# Patient Record
Sex: Female | Born: 1955 | Race: Black or African American | Hispanic: No | Marital: Single | Smoking: Never smoker
Health system: Southern US, Community
[De-identification: ages and names within clinical notes are randomized; demographics above are authoritative.]

## PROBLEM LIST (undated history)

## (undated) DIAGNOSIS — D219 Benign neoplasm of connective and other soft tissue, unspecified: Secondary | ICD-10-CM

## (undated) HISTORY — DX: Benign neoplasm of connective and other soft tissue, unspecified: D21.9

## (undated) HISTORY — PX: OTHER SURGICAL HISTORY: SHX169

## (undated) HISTORY — PX: EYE SURGERY: SHX253

---

## 1999-05-19 ENCOUNTER — Other Ambulatory Visit: Admission: RE | Admit: 1999-05-19 | Discharge: 1999-05-19 | Payer: Self-pay | Admitting: *Deleted

## 1999-05-19 ENCOUNTER — Encounter (INDEPENDENT_AMBULATORY_CARE_PROVIDER_SITE_OTHER): Payer: Self-pay | Admitting: Specialist

## 2000-06-18 ENCOUNTER — Encounter: Payer: Self-pay | Admitting: *Deleted

## 2000-06-18 ENCOUNTER — Ambulatory Visit (HOSPITAL_COMMUNITY): Admission: RE | Admit: 2000-06-18 | Discharge: 2000-06-18 | Payer: Self-pay | Admitting: *Deleted

## 2001-07-21 ENCOUNTER — Other Ambulatory Visit: Admission: RE | Admit: 2001-07-21 | Discharge: 2001-07-21 | Payer: Self-pay | Admitting: *Deleted

## 2002-04-21 ENCOUNTER — Ambulatory Visit: Admission: RE | Admit: 2002-04-21 | Discharge: 2002-04-21 | Payer: Self-pay | Admitting: Gynecology

## 2002-04-22 ENCOUNTER — Ambulatory Visit (HOSPITAL_COMMUNITY): Admission: RE | Admit: 2002-04-22 | Discharge: 2002-04-22 | Payer: Self-pay | Admitting: Gynecology

## 2002-04-22 ENCOUNTER — Encounter: Payer: Self-pay | Admitting: Gynecology

## 2002-04-23 ENCOUNTER — Encounter: Payer: Self-pay | Admitting: Gynecology

## 2002-04-23 ENCOUNTER — Ambulatory Visit (HOSPITAL_COMMUNITY): Admission: RE | Admit: 2002-04-23 | Discharge: 2002-04-23 | Payer: Self-pay | Admitting: Gynecology

## 2002-05-04 ENCOUNTER — Ambulatory Visit: Admission: RE | Admit: 2002-05-04 | Discharge: 2002-05-04 | Payer: Self-pay | Admitting: *Deleted

## 2003-07-01 ENCOUNTER — Other Ambulatory Visit: Admission: RE | Admit: 2003-07-01 | Discharge: 2003-07-01 | Payer: Self-pay | Admitting: *Deleted

## 2007-02-05 ENCOUNTER — Other Ambulatory Visit: Admission: RE | Admit: 2007-02-05 | Discharge: 2007-02-05 | Payer: Self-pay | Admitting: *Deleted

## 2008-12-31 ENCOUNTER — Ambulatory Visit (HOSPITAL_BASED_OUTPATIENT_CLINIC_OR_DEPARTMENT_OTHER): Admission: RE | Admit: 2008-12-31 | Discharge: 2008-12-31 | Payer: Self-pay | Admitting: Family Medicine

## 2008-12-31 ENCOUNTER — Ambulatory Visit: Payer: Self-pay | Admitting: Interventional Radiology

## 2009-06-24 ENCOUNTER — Ambulatory Visit (HOSPITAL_COMMUNITY): Admission: RE | Admit: 2009-06-24 | Discharge: 2009-06-24 | Payer: Self-pay | Admitting: Obstetrics & Gynecology

## 2010-07-12 ENCOUNTER — Encounter: Payer: Self-pay | Admitting: Internal Medicine

## 2010-07-24 ENCOUNTER — Encounter (INDEPENDENT_AMBULATORY_CARE_PROVIDER_SITE_OTHER): Payer: Self-pay | Admitting: *Deleted

## 2010-08-25 ENCOUNTER — Encounter (INDEPENDENT_AMBULATORY_CARE_PROVIDER_SITE_OTHER): Payer: Self-pay | Admitting: *Deleted

## 2010-08-30 ENCOUNTER — Ambulatory Visit
Admission: RE | Admit: 2010-08-30 | Discharge: 2010-08-30 | Payer: Self-pay | Source: Home / Self Care | Attending: Internal Medicine | Admitting: Internal Medicine

## 2010-09-08 ENCOUNTER — Encounter: Payer: Self-pay | Admitting: Internal Medicine

## 2010-09-08 ENCOUNTER — Ambulatory Visit
Admission: RE | Admit: 2010-09-08 | Discharge: 2010-09-08 | Payer: Self-pay | Source: Home / Self Care | Attending: Internal Medicine | Admitting: Internal Medicine

## 2010-09-26 NOTE — Letter (Signed)
Summary: Pre Visit Letter Revised  [facility]  [address] [place], [place] [address]   Phone: [phone]  Fax: [phone]        07/24/2010 MRN: [id number] Jordan Miles [address] [place], [place]  [address]             Procedure Date:  09/08/2010   Welcome to the Gastroenterology Division at [facility].    You are scheduled to see a nurse for your pre-procedure visit on 08/30/2010 at 1100 AM on the 3rd floor at [facility], [address]  We ask that you try to arrive at our office 15 minutes prior to your appointment time to allow for check-in.  Please take a minute to review the attached form.  If you answer "Yes" to one or more of the questions on the first page, we ask that you call the person listed at your earliest opportunity.  If you answer "No" to all of the questions, please complete the rest of the form and bring it to your appointment.    Your nurse visit will consist of discussing your medical and surgical history, your immediate family medical history, and your medications.   If you are unable to list all of your medications on the form, please bring the medication bottles to your appointment and we will list them.  We will need to be aware of both prescribed and over the counter drugs.  We will need to know exact dosage information as well.    Please be prepared to read and sign documents such as consent forms, a financial agreement, and acknowledgement forms.  If necessary, and with your consent, a friend or relative is welcome to sit-in on the nurse visit with you.  Please bring your insurance card so that we may make a copy of it.  If your insurance requires a referral to see a specialist, please bring your referral form from your primary care physician.  No co-pay is required for this nurse visit.     If you cannot keep your appointment, please call [phone] to cancel or reschedule prior to your appointment date.  This  allows [place] the opportunity to schedule an appointment for another patient in need of care.    Thank you for choosing [facility] for your medical needs.  We appreciate the opportunity to care for you.  Please visit [place] at our website  to learn more about our practice.  Sincerely, The Gastroenterology Division

## 2010-09-26 NOTE — Letter (Signed)
Summary: Pre Visit Letter Revised  [facility]  [address] [place], [place] [address]   Phone: [phone]  Fax: [phone]        07/12/2010 MRN: [id number]  Jordan Miles [address] [place], [place]  [address]             Procedure Date:  12-13 at 2:30pm   Welcome to the Gastroenterology Division at [facility].    You are scheduled to see a nurse for your pre-procedure visit on 07-25-10 at 4:30pm on the 3rd floor at [facility], [address].  We ask that you try to arrive at our office 15 minutes prior to your appointment time to allow for check-in.  Please take a minute to review the attached form.  If you answer "Yes" to one or more of the questions on the first page, we ask that you call the person listed at your earliest opportunity.  If you answer "No" to all of the questions, please complete the rest of the form and bring it to your appointment.    Your nurse visit will consist of discussing your medical and surgical history, your immediate family medical history, and your medications.   If you are unable to list all of your medications on the form, please bring the medication bottles to your appointment and we will list them.  We will need to be aware of both prescribed and over the counter drugs.  We will need to know exact dosage information as well.    Please be prepared to read and sign documents such as consent forms, a financial agreement, and acknowledgement forms.  If necessary, and with your consent, a friend or relative is welcome to sit-in on the nurse visit with you.  Please bring your insurance card so that we may make a copy of it.  If your insurance requires a referral to see a specialist, please bring your referral form from your primary care physician.  No co-pay is required for this nurse visit.     If you cannot keep your appointment, please call [phone] to cancel or reschedule prior to your appointment date.   This allows [place] the opportunity to schedule an appointment for another patient in need of care.    Thank you for choosing [facility] for your medical needs.  We appreciate the opportunity to care for you.  Please visit [place] at our website  to learn more about our practice.  Sincerely, The Gastroenterology Division

## 2010-09-28 NOTE — Procedures (Signed)
Summary: Colonoscopy  Patient: Jordan Miles Note: All result statuses are Final unless otherwise noted.  Tests: (1) Colonoscopy (COL)   COL Colonoscopy           DONE (C)     [facility]     [address]     [place], [place]  [address]           COLONOSCOPY PROCEDURE REPORT           PATIENT:  Jordan, Miles  MR#:  [id number]     BIRTHDATE:  06-19-1956, 55 yrs. old  GENDER:  female     ENDOSCOPIST:  Hedwig Morton. Juanda Chance, MD     REF. BY:  Bernadette Hoit, M.D.     PROCEDURE DATE:  09/08/2010     PROCEDURE:  Colonoscopy 19[address]     ASA CLASS:  Class I     INDICATIONS:  family history of colon cancer, mother with colon     cancer age 55, MGM  suspected colon cancer ( had colostomy at age     2, lived  1 year after colostomy), all relatives  are from     [place]     MEDICATIONS:   Versed 5 mg, Fentanyl 50 mcg           DESCRIPTION OF PROCEDURE:   After the risks benefits and     alternatives of the procedure were thoroughly explained, informed     consent was obtained.  Digital rectal exam was performed and     revealed no rectal masses.   The LB 180AL [id number] endoscope was     introduced through the anus and advanced to the cecum, which was     identified by both the appendix and ileocecal valve, without     limitations.  The quality of the prep was good, using MiraLax.     The instrument was then slowly withdrawn as the colon was fully     examined.     <<PROCEDUREIMAGES>>           FINDINGS:  Mild diverticulosis was found in the sigmoid colon (see     image1 and image2).  This was otherwise a normal examination of     the colon (see image5, image4, and image3).   Retroflexed views in     the rectum revealed no abnormalities.    The scope was then     withdrawn from the patient and the procedure completed.           COMPLICATIONS:  None     ENDOSCOPIC IMPRESSION:     1) Mild diverticulosis in the sigmoid colon     2) Otherwise normal examination     3) Normal  colonoscopy     RECOMMENDATIONS:     1) high fiber diet     REPEAT EXAM:  In 5 year(s) for.           ______________________________     Hedwig Morton. Juanda Chance, MD           CC:  Bernadette Hoit, M.D.           n.     REVISED:  09/08/2010 10:59 AM     eSIGNED:   Hedwig Morton. Jayleena Stille at 09/08/2010 10:59 AM           Przybylski, Khushboo, [id number]  Note: An exclamation mark (!) indicates a result that was not dispersed into the flowsheet. Document Creation Date: 09/08/2010 11:00 AM _______________________________________________________________________  (1)  Order result status: Final Collection or observation date-time: 09/08/2010 09:51 Requested date-time:  Receipt date-time:  Reported date-time:  Referring Physician:   Ordering Physician: Lina Sar [phone]) Specimen Source:  Source: Launa Grill Order Number: [id number] Lab site:   Appended Document: Colonoscopy    Clinical Lists Changes  Observations: Added new observation of COLONNXTDUE: 08/2015 (09/08/2010 14:24)

## 2010-09-28 NOTE — Miscellaneous (Signed)
Summary: [facility] Previsit/prep  Clinical Lists Changes  Medications: Added new medication of MIRALAX   POWD (POLYETHYLENE GLYCOL 3350) As per prep  instructions. - Signed Added new medication of METOCLOPRAMIDE HCL 10 MG  TABS (METOCLOPRAMIDE HCL) As per prep instructions. - Signed Added new medication of DULCOLAX 5 MG  TBEC (BISACODYL) Day before procedure take 2 at 3pm and 2 at 8pm. - Signed Rx of MIRALAX   POWD (POLYETHYLENE GLYCOL 3350) As per prep  instructions.;  #255gm x 0;  Signed;  Entered by: Wyona Almas RN;  Authorized by: Hart Carwin MD;  Method used: Electronically to [facility] [phone] [place], [place], [place]  [address], Ph: [phone], Fax: [phone] Rx of METOCLOPRAMIDE HCL 10 MG  TABS (METOCLOPRAMIDE HCL) As per prep instructions.;  #2 x 0;  Signed;  Entered by: Wyona Almas RN;  Authorized by: Hart Carwin MD;  Method used: Electronically to [facility] [phone] [place], [place], [place]  [address], Ph: [phone], Fax: [phone] Rx of DULCOLAX 5 MG  TBEC (BISACODYL) Day before procedure take 2 at 3pm and 2 at 8pm.;  #4 x 0;  Signed;  Entered by: Wyona Almas RN;  Authorized by: Hart Carwin MD;  Method used: Electronically to [facility] [phone] [place], [place], [place]  [address], Ph: [phone], Fax: [phone] Observations: Added new observation of NKA: T (08/30/2010 10:54)    Prescriptions: DULCOLAX 5 MG  TBEC (BISACODYL) Day before procedure take 2 at 3pm and 2 at 8pm.  #4 x 0   Entered by:   Wyona Almas RN   Authorized by:   Hart Carwin MD   Signed by:   Wyona Almas RN on 08/30/2010   Method used:   Electronically to        [facility] [address] (retail)       [phone]       [place], [place]  [address]       Ph: [phone]       Fax: [phone]   RxID:   [id number] METOCLOPRAMIDE  HCL 10 MG  TABS (METOCLOPRAMIDE HCL) As per prep instructions.  #2 x 0   Entered by:   Wyona Almas RN   Authorized by:   Hart Carwin MD   Signed by:   Wyona Almas RN on 08/30/2010   Method used:   Electronically to        [facility] [address] (retail)       [phone]       [place], [place]  [address]       Ph: [phone]       Fax: [phone]   RxID:   [id number] MIRALAX   POWD (POLYETHYLENE GLYCOL 3350) As per prep  instructions.  #255gm x 0   Entered by:   Wyona Almas RN   Authorized by:   Hart Carwin MD   Signed by:   Wyona Almas RN on 08/30/2010   Method used:   Electronically to        [facility] [address] (retail)       [phone]       [place], [place]  [address]       Ph: [phone]  Fax: [phone]   RxID:   [id number]

## 2010-09-28 NOTE — Letter (Signed)
Summary: [facility] Instructions  [facility]  [address] [place], [place] [address]   Phone: [phone]  Fax: [phone]       Jordan Miles    Jul 21, 1956    MRN: [id number]       Procedure Day /Date: Friday 09-08-10     Arrival Time:  8:30 a.m.     Procedure Time: 9:30 a.m.     Location of Procedure:                    _x _  [facility] (4th Floor)   PREPARATION FOR COLONOSCOPY WITH MIRALAX  Starting 5 days prior to your procedure  09-03-10 do not eat nuts, seeds, popcorn, corn, beans, peas,  salads, or any raw vegetables.  Do not take any fiber supplements (e.g. Metamucil, Citrucel, and Benefiber). ____________________________________________________________________________________________________   THE DAY BEFORE YOUR PROCEDURE         DATE:  09-07-10    DAY: Thursday  1   Drink clear liquids the entire day-NO SOLID FOOD  2   Do not drink anything colored red or purple.  Avoid juices with pulp.  No orange juice.  3   Drink at least 64 oz. (8 glasses) of fluid/clear liquids during the day to prevent dehydration and help the prep work efficiently.  CLEAR LIQUIDS INCLUDE: Water Jello Ice Popsicles Tea (sugar ok, no milk/cream) Powdered fruit flavored drinks Coffee (sugar ok, no milk/cream) Gatorade Juice: apple, white grape, white cranberry  Lemonade Clear bullion, consomm, broth Carbonated beverages (any kind) Strained chicken noodle soup Hard Candy  4   Mix the entire bottle of Miralax with 64 oz. of Gatorade/Powerade in the morning and put in the refrigerator to chill.  5   At 3:00 pm take 2 Dulcolax/Bisacodyl tablets.  6   At 4:30 pm take one Reglan/Metoclopramide tablet.  7  Starting at 5:00 pm drink one 8 oz glass of the Miralax mixture every 15-20 minutes until you have finished drinking the entire 64 oz.  You should finish drinking prep around 7:30 or 8:00 pm.  8   If you are nauseated, you may take the 2nd  Reglan/Metoclopramide tablet at 6:30 pm.        9    At 8:00 pm take 2 more DULCOLAX/Bisacodyl tablets.     THE DAY OF YOUR PROCEDURE      DATE:   09-08-10  DAY:  Friday   You may drink clear liquids until  7:30 a.m.  (2 HOURS BEFORE PROCEDURE).   MEDICATION INSTRUCTIONS  Unless otherwise instructed, you should take regular prescription medications with a small sip of water as early as possible the morning of your procedure.        OTHER INSTRUCTIONS  You will need a responsible adult at least 57 years of age to accompany you and drive you home.   This person must remain in the waiting room during your procedure.  Wear loose fitting clothing that is easily removed.  Leave jewelry and other valuables at home.  However, you may wish to bring a book to read or an iPod/MP3 player to listen to music as you wait for your procedure to start.  Remove all body piercing jewelry and leave at home.  Total time from sign-in until discharge is approximately 2-3 hours.  You should go home directly after your procedure and rest.  You can resume normal activities the day after your procedure.  The day of your procedure  you should not:   Drive   Make legal decisions   Operate machinery   Drink alcohol   Return to work  You will receive specific instructions about eating, activities and medications before you leave.   The above instructions have been reviewed and explained to me by   Wyona Almas RN  August 30, 2010 11:37 AM     I fully understand and can verbalize these instructions _____________________________ Date _______

## 2011-01-12 NOTE — Consult Note (Signed)
Jordan Miles, Jordan Miles                         ACCOUNT NO.:  [id number]   MEDICAL RECORD NO.:  [id number]                   PATIENT TYPE:  OUT   LOCATION:  GYN                                  FACILITY:  [facility]   PHYSICIAN:  Rande Brunt. Clarke-Pearson, M.D.      DATE OF BIRTH:  [birth year]   DATE OF CONSULTATION:  04/21/2002  DATE OF DISCHARGE:                                 GYN CONSULTATION   The patient is a 55 year old [name] seen in consultation at  the request of Dr. Jeanine Luz regarding management of uterine fibroids.   Currently, the patient is having considerable amount of pain, especially in  the right lower quadrant, difficulty with constipation, and worsening  hemorrhoids.  It is felt that her fibroids have become somewhat larger and  tender on palpation.   The patient has a past history of undergoing a myomectomy in 1996.  She has  never been pregnant and continues to desire to maintain fertility despite  her age of age 59.  She is requesting a myomectomy.   The patient has some irregular menstrual periods.  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  She has hot flushes most nights and appears to be approaching  menopause.   PAST MEDICAL HISTORY:  Medical illnesses:  None.   PAST SURGICAL HISTORY:  Umbilical herniorrhaphy as a child, myomectomy 1996.   ALLERGIES:  None.   OBSTETRIC HISTORY:  Gravida 0.   FAMILY HISTORY:  Negative for breast, ovarian, or colon cancer.   SOCIAL HISTORY:  The patient is single.  She does not smoke.  She drinks  alcohol on occasion.  She works in the Engineer, production.   CURRENT MEDICATIONS:  Multivitamins.   REVIEW OF SYMPTOMS:  Negative except as noted in history of present illness.   PHYSICAL EXAMINATION:  VITAL SIGNS:  Height 5 feet 2 inches, weight 168  pounds, blood pressure 124/84, pulse 72, respiratory rate 18.  GENERAL:  The  patient is a healthy female in no acute distress.  HEENT:  Negative.  NECK:  Supple without thyromegaly.  LYMPH:  There is no supraclavicular or inguinal adenopathy.  ABDOMEN:  Soft and nontender.  She does have a mass extending just below the  umbilicus and this is slightly tender to deep palpation.  PELVIC:  EGBUS, vagina, bladder, urethra normal.  Cervix is deviated  anteriorly.  Uterus is irregular and approximately 18-20 weeks size reaching  just below the umbilicus.  It is slightly mobile.  Rectovaginal examination  confirms the patient does have mild to moderate hemorrhoids.   IMPRESSION:  Uterine fibroids.  Review of records indicates the patient had  18 week sized fibroids in the year 2000.  These may be slightly bigger on  clinical examination today.  I do note she has had a past ultrasound which  showed no evidence of ureteral obstruction, however, that  was nearly two  years ago.   Relatively certain that her discomfort is coming from her fibroids.  However, I think it is worth considering all of her options.  While the  patient is considering myomectomy (in the hopes that she may preserve  fertility as well as because she is afraid she will have emotional  difficulties following a hysterectomy), I think this is not really the best  medical or surgical option for the patient.  I did indicate to her that if  surgical intervention were considered, an abdominal hysterectomy would  certainly, on average, be less morbid.  However, she is quite concerned and  anxious about hysterectomy indicating that other women in her family that  have had hysterectomies have had great difficulties and essentially from an  emotional point of view she would refuse a hysterectomy.   Alternative methods of management, given the fact that this patient is close  to menopause, would be consideration of the use of Depo Lupron for six  months.  I think it is possible that there may be enough shrinkage  of the  fibroids that she may be relieved of her discomfort which is her primary  symptom.  The patient would consider this, it seems to me, after discussing  it with her at length.   While she is considering her options, I would suggest we obtain a pelvic and  renal ultrasound to evaluate the size of the uterine fibroids, the  endometrial stripe, and whether there is any evidence of hydronephrosis.  The patient will contact us or Dr. Delorse Lek office when she has decided how  she would like to proceed as she is considering medical versus surgical  options.                                               Daniel L. Stanford Breed, M.D.    DLC/MEDQ  D:  04/21/2002  T:  04/22/2002  Job:  [id number]   cc:   Almedia Balls. Randell Patient, M.D.   Telford Nab, R.N.

## 2013-03-24 ENCOUNTER — Other Ambulatory Visit (HOSPITAL_COMMUNITY): Payer: Self-pay | Admitting: Obstetrics & Gynecology

## 2013-03-24 ENCOUNTER — Other Ambulatory Visit (HOSPITAL_COMMUNITY): Payer: Self-pay | Admitting: Family Medicine

## 2013-03-24 DIAGNOSIS — Z1231 Encounter for screening mammogram for malignant neoplasm of breast: Secondary | ICD-10-CM

## 2013-04-03 ENCOUNTER — Ambulatory Visit (HOSPITAL_COMMUNITY)
Admission: RE | Admit: 2013-04-03 | Discharge: 2013-04-03 | Disposition: A | Discharge status: Home / Self Care | Payer: BC Managed Care – PPO | Source: Ambulatory Visit | Attending: Obstetrics & Gynecology | Admitting: Obstetrics & Gynecology

## 2013-04-03 DIAGNOSIS — Z1231 Encounter for screening mammogram for malignant neoplasm of breast: Secondary | ICD-10-CM

## 2014-02-12 DIAGNOSIS — M659 Synovitis and tenosynovitis, unspecified: Secondary | ICD-10-CM | POA: Insufficient documentation

## 2014-02-12 DIAGNOSIS — M79603 Pain in arm, unspecified: Secondary | ICD-10-CM | POA: Insufficient documentation

## 2014-02-12 DIAGNOSIS — D259 Leiomyoma of uterus, unspecified: Secondary | ICD-10-CM | POA: Insufficient documentation

## 2014-02-12 DIAGNOSIS — E7800 Pure hypercholesterolemia, unspecified: Secondary | ICD-10-CM | POA: Insufficient documentation

## 2014-02-12 DIAGNOSIS — F4322 Adjustment disorder with anxiety: Secondary | ICD-10-CM | POA: Insufficient documentation

## 2014-02-12 DIAGNOSIS — M25511 Pain in right shoulder: Secondary | ICD-10-CM | POA: Insufficient documentation

## 2014-02-12 DIAGNOSIS — K644 Residual hemorrhoidal skin tags: Secondary | ICD-10-CM | POA: Insufficient documentation

## 2014-02-12 DIAGNOSIS — J209 Acute bronchitis, unspecified: Secondary | ICD-10-CM | POA: Insufficient documentation

## 2014-02-12 DIAGNOSIS — H16229 Keratoconjunctivitis sicca, not specified as Sjogren's, unspecified eye: Secondary | ICD-10-CM | POA: Insufficient documentation

## 2014-02-12 DIAGNOSIS — E663 Overweight: Secondary | ICD-10-CM | POA: Insufficient documentation

## 2014-02-12 DIAGNOSIS — J309 Allergic rhinitis, unspecified: Secondary | ICD-10-CM | POA: Insufficient documentation

## 2014-02-12 DIAGNOSIS — N951 Menopausal and female climacteric states: Secondary | ICD-10-CM | POA: Insufficient documentation

## 2014-05-06 DIAGNOSIS — M25551 Pain in right hip: Secondary | ICD-10-CM | POA: Insufficient documentation

## 2014-05-06 DIAGNOSIS — R030 Elevated blood-pressure reading, without diagnosis of hypertension: Secondary | ICD-10-CM

## 2014-05-06 DIAGNOSIS — IMO0001 Reserved for inherently not codable concepts without codable children: Secondary | ICD-10-CM | POA: Insufficient documentation

## 2014-06-09 ENCOUNTER — Other Ambulatory Visit (HOSPITAL_COMMUNITY): Payer: Self-pay | Admitting: Obstetrics & Gynecology

## 2014-06-09 DIAGNOSIS — Z1231 Encounter for screening mammogram for malignant neoplasm of breast: Secondary | ICD-10-CM

## 2014-07-15 ENCOUNTER — Ambulatory Visit (HOSPITAL_COMMUNITY)
Admission: RE | Admit: 2014-07-15 | Discharge: 2014-07-15 | Disposition: A | Discharge status: Home / Self Care | Payer: No Typology Code available for payment source | Source: Ambulatory Visit | Attending: Obstetrics & Gynecology | Admitting: Obstetrics & Gynecology

## 2014-07-15 DIAGNOSIS — Z1231 Encounter for screening mammogram for malignant neoplasm of breast: Secondary | ICD-10-CM | POA: Insufficient documentation

## 2014-08-04 DIAGNOSIS — K219 Gastro-esophageal reflux disease without esophagitis: Secondary | ICD-10-CM | POA: Insufficient documentation

## 2014-09-21 ENCOUNTER — Ambulatory Visit (INDEPENDENT_AMBULATORY_CARE_PROVIDER_SITE_OTHER): Payer: 59

## 2014-09-21 VITALS — BP 139/82 | HR 86 | Resp 18

## 2014-09-21 DIAGNOSIS — Q828 Other specified congenital malformations of skin: Secondary | ICD-10-CM

## 2014-09-21 DIAGNOSIS — M2041 Other hammer toe(s) (acquired), right foot: Secondary | ICD-10-CM

## 2014-09-21 DIAGNOSIS — R52 Pain, unspecified: Secondary | ICD-10-CM

## 2014-09-21 NOTE — Patient Instructions (Signed)
Corns and Calluses Corns are small areas of thickened skin that usually occur on the top, sides, or tip of a toe. They contain a cone-shaped core with a point that can press on a nerve below. This causes pain. Calluses are areas of thickened skin that usually develop on hands, fingers, palms, soles of the feet, and heels. These are areas that experience frequent friction or pressure. CAUSES  Corns are usually the result of rubbing (friction) or pressure from shoes that are too tight or do not fit properly. Calluses are caused by repeated friction and pressure on the affected areas. SYMPTOMS  A hard growth on the skin.  Pain or tenderness under the skin.  Sometimes, redness and swelling.  Increased discomfort while wearing tight-fitting shoes. DIAGNOSIS  Your caregiver can usually tell what the problem is by doing a physical exam. TREATMENT  Removing the cause of the friction or pressure is usually the only treatment needed. However, sometimes medicines can be used to help soften the hardened, thickened areas. These medicines include salicylic acid plasters and 12% ammonium lactate lotion. These medicines should only be used under the direction of your caregiver. HOME CARE INSTRUCTIONS   Try to remove pressure from the affected area.  You may wear donut-shaped corn pads to protect your skin.  You may use a pumice stone or nonmetallic nail file to gently reduce the thickness of a corn.  Wear properly fitted footwear.  If you have calluses on the hands, wear gloves during activities that cause friction.  If you have diabetes, you should regularly examine your feet. Tell your caregiver if you notice any problems with your feet. SEEK IMMEDIATE MEDICAL CARE IF:   You have increased pain, swelling, redness, or warmth in the affected area.  Your corn or callus starts to drain fluid or bleeds.  You are not getting better, even with treatment. Document Released: 05/19/2004 Document  Revised: 11/05/2011 Document Reviewed: 04/10/2011 [facility] Patient Information 2015 [place], [place]. This information is not intended to replace advice given to you by your health care provider. Make sure you discuss any questions you have with your health care provider.   Corns are being caused by the Cricket or hammertoe deformity of the her third toe. The only way to provide permanent correction would be due to surgery to remove a portion of bone and straighten the toe. 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

## 2014-09-21 NOTE — Progress Notes (Signed)
   Subjective:    Patient ID: Jordan Miles, female    DOB: [birth year], 59 y.o.   MRN: [id number]  HPI my 3rd toe on my right foot and has been going on for about 6 months and is getting bigger and is sore and tender and hurts with shoes and I would like to see if I can get a shot and was not an issue when I was not working and now that I have gone back to work and is getting worse    Review of Systems  All other systems reviewed and are negative.      Objective:   Physical Exam 59 year old [place] American female well-developed well-nourished oriented 3 presents this time with a painful keratoses third toe right foot is been there for more than 6 months in duration patient is a history of previous surgery for hammertoe repair fifth toe right foot by Dr. Janus Molder.  Patient at this time is having pain with enclosed shoes walking and activities. X-rays confirm semirigid digital contracture of toes 234 with the third toe being most dorsally elevated at the IP joint. No open wounds no ulcers no secondary infections there is hemorrhage a keratoses with a punctate likely keratotic lesion over the proximal IP joint second third digit right foot. Ace and fourth digits also have some superficial keratoses although these are not painful or symptomatic at current time. Pedal pulses are palpable DP and PT +2 over 4 bilateral Refill time 3 seconds all digits. Epicritic and proprioceptive sensations intact and symmetric bilateral there is normal plantar response and DTRs. Dermatologic the skin color pigment normal hair growth absent nails normal there is keratoses HD 3 right with a nucleation or care punctate core.       Assessment & Plan:  Assessment hammertoe deformity associated poor keratoses keratotic lesion is debrided to foam padding dispensed. 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 Patient will  consider her options and follow-up in the future as needed patient did have a question about a steroid injection which I provided indicated would provide her temporary relief anklet evenly to break down or atrophy of the tissue and recommendations time is to avoid the steroid which is not a cure and may even create complication instead dispersion padding is applied after debridement follow-up when ready for surgery and hammertoe repair in the future.\  Harriet Masson DPM

## 2015-02-11 ENCOUNTER — Telehealth: Payer: Self-pay | Admitting: Internal Medicine

## 2015-02-14 NOTE — Telephone Encounter (Signed)
Left a message for patient to call back. 

## 2015-02-14 NOTE — Telephone Encounter (Signed)
Spoke with patient and scheduled OV on 02/25/15 at 2:00 PM.

## 2015-02-25 ENCOUNTER — Ambulatory Visit (INDEPENDENT_AMBULATORY_CARE_PROVIDER_SITE_OTHER): Payer: 59 | Admitting: Internal Medicine

## 2015-02-25 ENCOUNTER — Encounter: Payer: Self-pay | Admitting: Internal Medicine

## 2015-02-25 ENCOUNTER — Other Ambulatory Visit: Payer: 59

## 2015-02-25 VITALS — BP 142/82 | HR 88 | Ht 61.75 in | Wt 172.8 lb

## 2015-02-25 DIAGNOSIS — K219 Gastro-esophageal reflux disease without esophagitis: Secondary | ICD-10-CM

## 2015-02-25 DIAGNOSIS — R109 Unspecified abdominal pain: Secondary | ICD-10-CM

## 2015-02-25 LAB — HEPATIC FUNCTION PANEL
ALT: 16 U/L (ref 0–35)
AST: 17 U/L (ref 0–37)
Albumin: 4.2 g/dL (ref 3.5–5.2)
Alkaline Phosphatase: 52 U/L (ref 39–117)
Bilirubin, Direct: 0.1 mg/dL (ref 0.0–0.3)
Total Bilirubin: 0.6 mg/dL (ref 0.2–1.2)
Total Protein: 8.6 g/dL — ABNORMAL HIGH (ref 6.0–8.3)

## 2015-02-25 MED ORDER — RANITIDINE HCL 300 MG PO TABS: 300.0000 mg | ORAL_TABLET | Freq: Every day | ORAL | Status: DC

## 2015-02-25 NOTE — Patient Instructions (Addendum)
You have been scheduled for an abdominal ultrasound at [facility]iology (1st floor of hospital) on 03/03/2015 at 9:30am. Please arrive 15 minutes prior to your appointment for registration. Make certain not to have anything to eat or drink 6 hours prior to your appointment. Should you need to reschedule your appointment, please contact radiology at [phone]. This test typically takes about 30 minutes to perform.  Go to the basement for labs today  Increase  Fiber to two every day We will send in your prescription to your pharmacy Dr Ruben Gottron  Dr Dellis Filbert

## 2015-02-25 NOTE — Progress Notes (Signed)
Jordan Miles 11/18/1955 [id number]  Note: This dictation was prepared with Dragon digital system. Any transcriptional errors that result from this procedure are unintentional.   History of Present Illness: This is a 59 year old [name] with two GI related  Concerns, the first one is a gastroesophageal reflux causing heartburn during the day, most  postprandially. She has occasional regurgitation of the food at night if she eats large meal. She denies dysphagia. Denies nocturnal cough or hoarseness. We have seen her in the past for colorectal screening.   Another concern is left lower quadrant abdominal pain which has bothered her off and on for several years. Colonoscopy in January 2012 was normal .There  is a positive family of colon cancer in a close relative. She was found to have mild diverticulosis. She currently does not have any left lower quadrant abdominal pain. Hx of pelvic surgery included myomectomy.Denies  being constipated. She denies rectal bleeding. She takes one fiber pill once or twice a week. Her weight has increased in last few years. She underwent  gynecological evaluation to rule out possibility of gyn  related pelvic pain but her exam was  normal. She is postmenopausal. Last hemoglobin was 11.9 hematocrit 35.8 with MCV of 87.7    No past medical history on file.  No past surgical history on file.  No Known Allergies  Family history and social history have been reviewed.  Review of Systems: Gastroesophageal reflux. Negative for odynophagia or dysphagia  The remainder of the 10 point ROS is negative except as outlined in the H&P  Physical Exam: General Appearance Well developed, in no distress, mildly overweight Eyes  Non icteric  HEENT  Non traumatic, normocephalic  Mouth No lesion, tongue papillated, no cheilosis Neck Supple without adenopathy, thyroid not enlarged, no carotid bruits, no JVD Lungs Clear to auscultation bilaterally COR Normal S1,  normal S2, regular rhythm, no murmur, quiet precordium Abdomen protuberant but soft. Nontender. Minimal tenderness in left lower quadrant on deep palpation. No rebound or fullness, no CVA tenderness. Liver edge at costal margin Rectal soft Hemoccult-negative stool Extremities  No pedal edema Skin No lesions Neurological Alert and oriented x 3 Psychological Normal mood and affect  Assessment and Plan:   59 year old [name] with gastroesophageal reflux  Increasing gradually for several months , partially relieved  By TUMS  There is no dysphagia. She does not want to try any medications unless she knows  what is causing her problem. I offered upper endoscopy to rule out Barrett's esophagus but she would rather try acid reducing medication for 4-6 weeks and then let me know. We will start ranitidine 300 mg at bedtime as well as antireflux measures.Schedule upper abdominal ultrasound and LFT's  Left lower quadrant abdominal pain with history of mild diverticulosis. I suspect irritable bowel syndrome or functional constipation. She will increased fiber pills to 2 every day, high-fiber diet, I offered Levsin sublingually 0.125 mg but she declines medications. She will continue probiotics. She will be due for recall colonoscopy in 2022 unless she developsl specific problems. She has a mild normocytic anemia which is Hemoccult-negative.   Check sprue profile, Iron profile.      Jordan Miles 02/25/2015

## 2015-03-01 LAB — GLIA (IGA/G) + TTG IGA
GLIADIN IGG: 2 U (ref <20)
Gliadin IgA: 8 Units (ref <20)
TISSUE TRANSGLUTAMINASE AB, IGA: 1 U/mL (ref <4)

## 2015-03-03 ENCOUNTER — Ambulatory Visit (HOSPITAL_COMMUNITY): Payer: 59

## 2015-03-11 ENCOUNTER — Ambulatory Visit (HOSPITAL_COMMUNITY): Payer: 59

## 2015-03-18 ENCOUNTER — Ambulatory Visit (HOSPITAL_COMMUNITY)
Admission: RE | Admit: 2015-03-18 | Discharge: 2015-03-18 | Disposition: A | Discharge status: Home / Self Care | Payer: 59 | Source: Ambulatory Visit | Attending: Internal Medicine | Admitting: Internal Medicine

## 2015-03-18 DIAGNOSIS — R109 Unspecified abdominal pain: Secondary | ICD-10-CM

## 2015-06-29 DIAGNOSIS — D171 Benign lipomatous neoplasm of skin and subcutaneous tissue of trunk: Secondary | ICD-10-CM | POA: Insufficient documentation

## 2015-07-13 ENCOUNTER — Other Ambulatory Visit: Payer: Self-pay

## 2015-07-13 DIAGNOSIS — Z1231 Encounter for screening mammogram for malignant neoplasm of breast: Secondary | ICD-10-CM

## 2015-08-12 ENCOUNTER — Ambulatory Visit
Admission: RE | Admit: 2015-08-12 | Discharge: 2015-08-12 | Disposition: A | Discharge status: Home / Self Care | Payer: 59 | Source: Ambulatory Visit

## 2015-08-12 DIAGNOSIS — Z1231 Encounter for screening mammogram for malignant neoplasm of breast: Secondary | ICD-10-CM

## 2015-10-21 ENCOUNTER — Encounter: Payer: Self-pay | Admitting: Gastroenterology

## 2016-07-05 IMAGING — MG MM DIGITAL SCREENING BILAT
4 series · 4 of 4 positions shown · non-contrast
Comparison: Previous exam(s).

CLINICAL DATA: Screening.

EXAM:
DIGITAL SCREENING BILATERAL MAMMOGRAM WITH CAD

[L MLO]
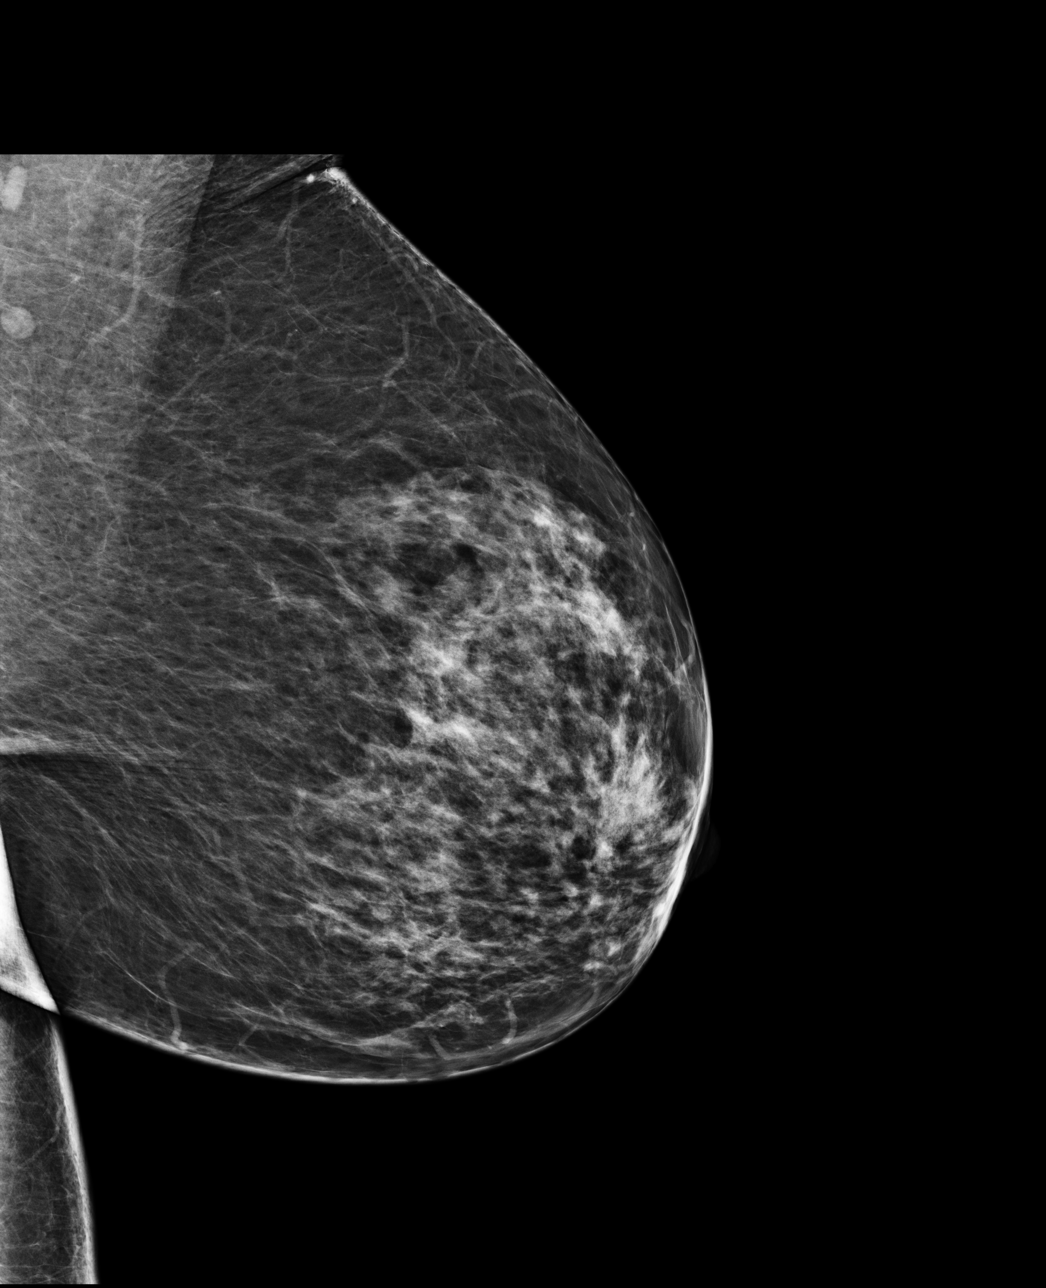

[R CC]
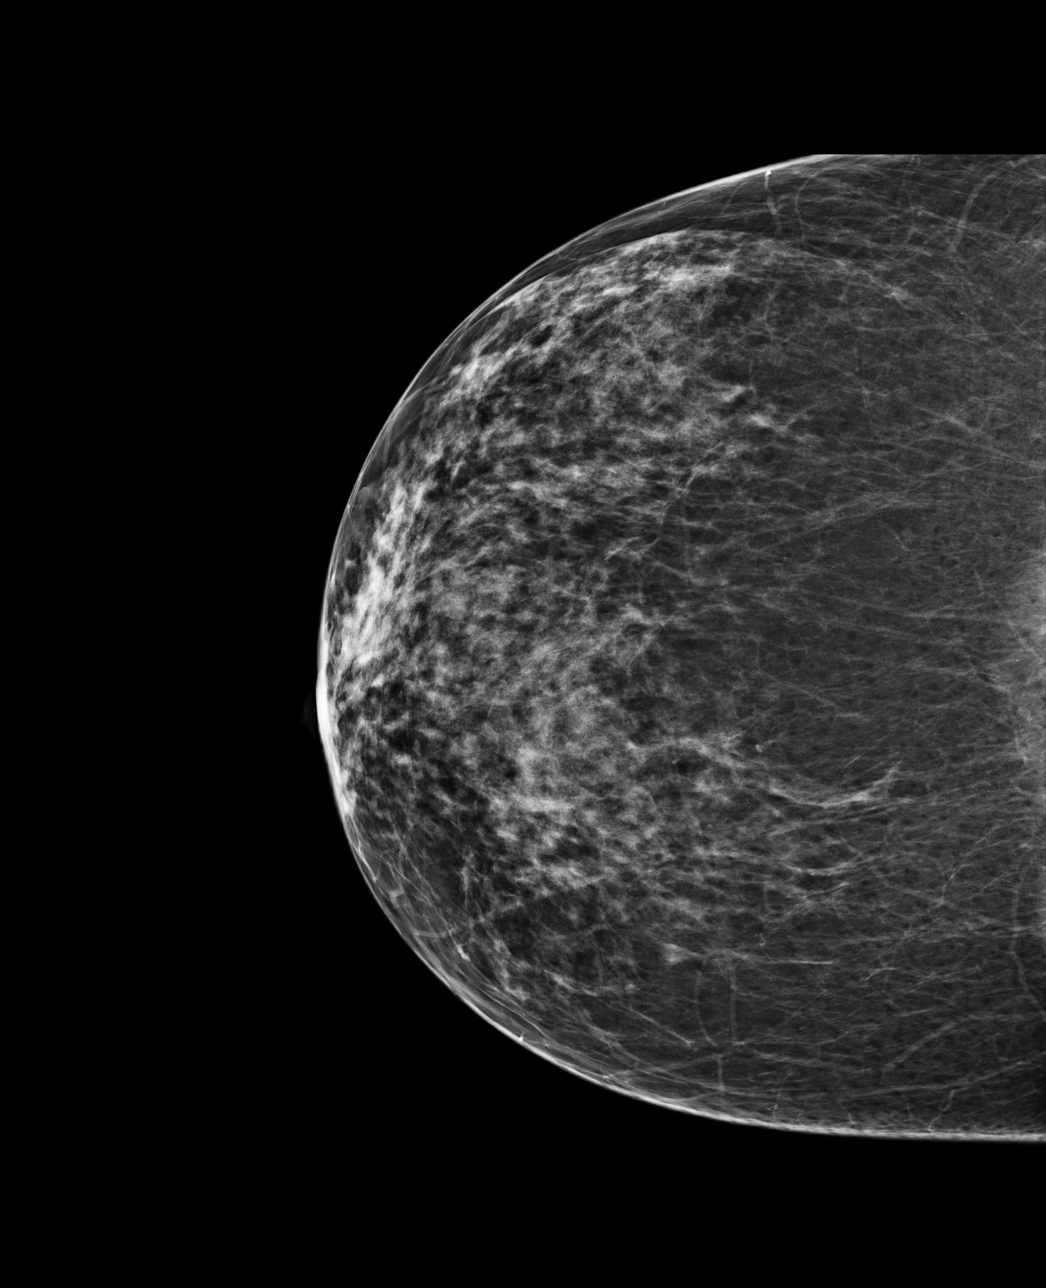

[L CC]
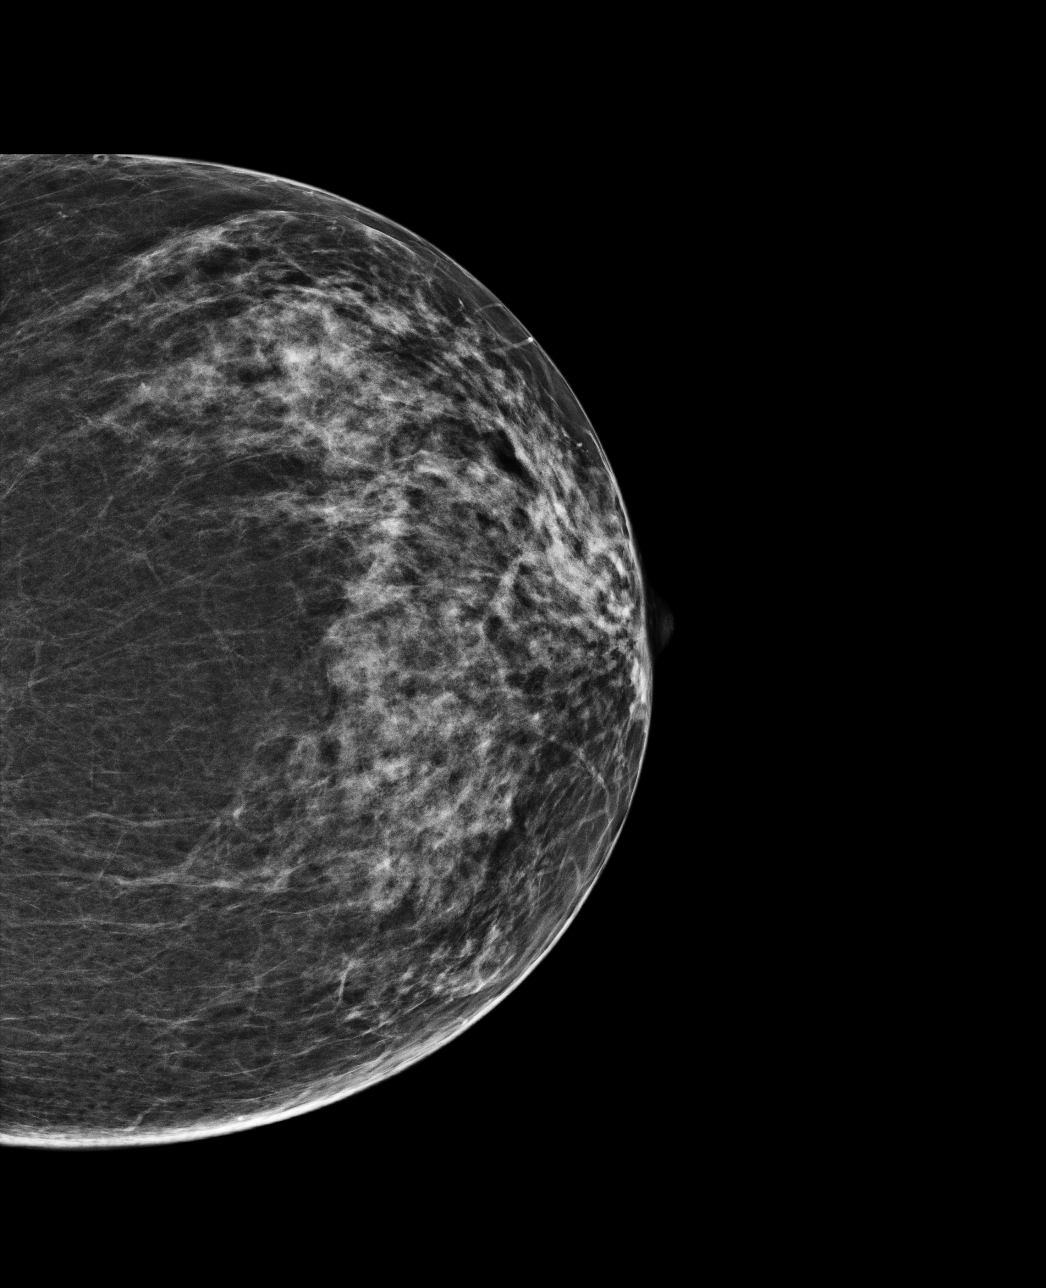

[R MLO]
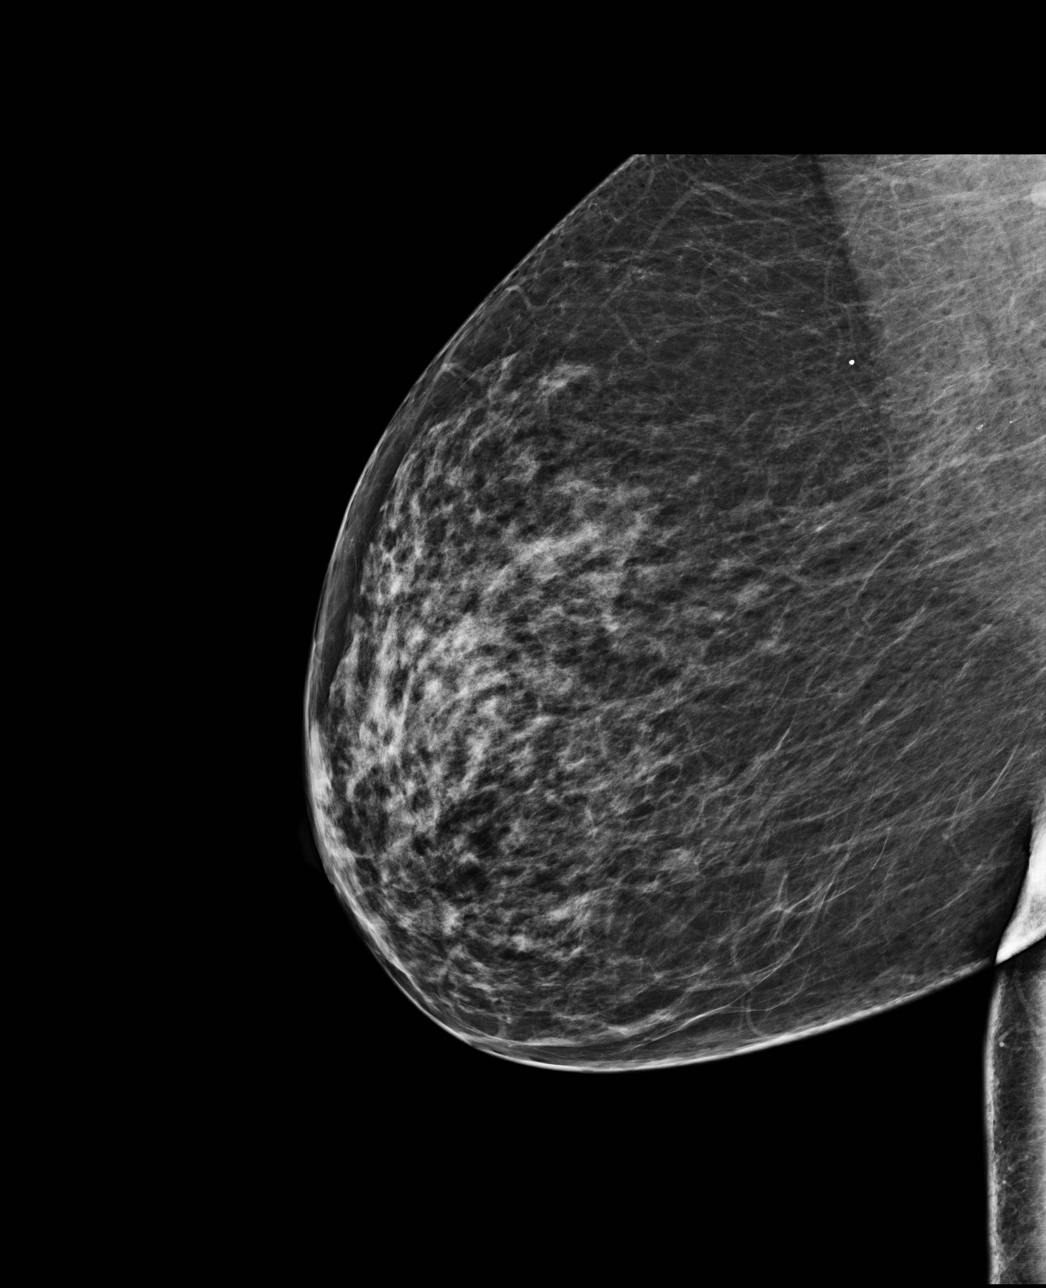

[4 of 4 positions shown; findings below may reference images not displayed]

ACR Breast Density Category c: The breast tissue is heterogeneously
dense, which may obscure small masses.
FINDINGS: There are no findings suspicious for malignancy. Images were
processed with CAD.
IMPRESSION: No mammographic evidence of malignancy. A result letter of this
screening mammogram will be mailed directly to the patient.

RECOMMENDATION:
Screening mammogram in one year. (Code:[id number])

BI-RADS CATEGORY  1: Negative.

## 2016-07-11 ENCOUNTER — Other Ambulatory Visit: Payer: Self-pay | Admitting: Obstetrics & Gynecology

## 2016-07-11 DIAGNOSIS — Z800 Family history of malignant neoplasm of digestive organs: Secondary | ICD-10-CM | POA: Insufficient documentation

## 2016-07-11 DIAGNOSIS — Z8371 Family history of colonic polyps: Secondary | ICD-10-CM | POA: Insufficient documentation

## 2016-07-11 DIAGNOSIS — Z1231 Encounter for screening mammogram for malignant neoplasm of breast: Secondary | ICD-10-CM

## 2016-08-15 ENCOUNTER — Ambulatory Visit
Admission: RE | Admit: 2016-08-15 | Discharge: 2016-08-15 | Disposition: A | Discharge status: Home / Self Care | Payer: Self-pay | Source: Ambulatory Visit | Attending: Obstetrics & Gynecology | Admitting: Obstetrics & Gynecology

## 2016-08-15 DIAGNOSIS — Z1231 Encounter for screening mammogram for malignant neoplasm of breast: Secondary | ICD-10-CM

## 2017-06-07 ENCOUNTER — Other Ambulatory Visit: Payer: Self-pay | Admitting: Obstetrics & Gynecology

## 2017-06-07 DIAGNOSIS — Z1231 Encounter for screening mammogram for malignant neoplasm of breast: Secondary | ICD-10-CM

## 2017-07-12 ENCOUNTER — Encounter: Payer: 59 | Admitting: Obstetrics & Gynecology

## 2017-07-26 DIAGNOSIS — H0589 Other disorders of orbit: Secondary | ICD-10-CM | POA: Insufficient documentation

## 2017-08-16 ENCOUNTER — Ambulatory Visit: Payer: Self-pay

## 2018-02-25 DIAGNOSIS — Z79899 Other long term (current) drug therapy: Secondary | ICD-10-CM | POA: Insufficient documentation

## 2018-02-25 DIAGNOSIS — D869 Sarcoidosis, unspecified: Secondary | ICD-10-CM | POA: Insufficient documentation

## 2018-04-16 ENCOUNTER — Ambulatory Visit: Payer: Self-pay

## 2018-04-16 ENCOUNTER — Ambulatory Visit
Admission: RE | Admit: 2018-04-16 | Discharge: 2018-04-16 | Disposition: A | Discharge status: Home / Self Care | Payer: BLUE CROSS/BLUE SHIELD | Source: Ambulatory Visit | Attending: Obstetrics & Gynecology | Admitting: Obstetrics & Gynecology

## 2018-04-16 DIAGNOSIS — Z1231 Encounter for screening mammogram for malignant neoplasm of breast: Secondary | ICD-10-CM

## 2018-07-08 DIAGNOSIS — H5213 Myopia, bilateral: Secondary | ICD-10-CM | POA: Insufficient documentation

## 2018-07-08 DIAGNOSIS — H2513 Age-related nuclear cataract, bilateral: Secondary | ICD-10-CM | POA: Insufficient documentation

## 2018-07-08 DIAGNOSIS — H02401 Unspecified ptosis of right eyelid: Secondary | ICD-10-CM | POA: Insufficient documentation

## 2018-07-08 DIAGNOSIS — H43812 Vitreous degeneration, left eye: Secondary | ICD-10-CM | POA: Insufficient documentation

## 2018-07-08 DIAGNOSIS — H524 Presbyopia: Secondary | ICD-10-CM

## 2018-08-01 ENCOUNTER — Encounter: Payer: BLUE CROSS/BLUE SHIELD | Admitting: Obstetrics & Gynecology

## 2018-08-14 ENCOUNTER — Encounter: Payer: BLUE CROSS/BLUE SHIELD | Admitting: Obstetrics & Gynecology

## 2018-08-15 ENCOUNTER — Encounter: Payer: Self-pay | Admitting: Obstetrics & Gynecology

## 2018-08-15 ENCOUNTER — Ambulatory Visit: Payer: BLUE CROSS/BLUE SHIELD | Admitting: Obstetrics & Gynecology

## 2018-08-15 VITALS — BP 140/90 | Ht 61.0 in | Wt 149.8 lb

## 2018-08-15 DIAGNOSIS — Z780 Asymptomatic menopausal state: Secondary | ICD-10-CM | POA: Diagnosis not present

## 2018-08-15 DIAGNOSIS — Z13820 Encounter for screening for osteoporosis: Secondary | ICD-10-CM

## 2018-08-15 DIAGNOSIS — D869 Sarcoidosis, unspecified: Secondary | ICD-10-CM | POA: Diagnosis not present

## 2018-08-15 DIAGNOSIS — Z01419 Encounter for gynecological examination (general) (routine) without abnormal findings: Secondary | ICD-10-CM

## 2018-08-15 NOTE — Progress Notes (Signed)
Jordan Miles 1955-12-22 [id number]   History:    62 y.o. G0 single  RP:  New (>3 yrs) patient presenting for annual gyn exam   HPI: Menopause, well on no HRT.  No PMB.  No pelvic pain.  Abstinent.  Urine/BMs wnl.  Breasts normal.  BMI 28.30.  Sarcoidosis Rt eye.  Health labs with family physician.  Past medical history,surgical history, family history and social history were all reviewed and documented in the EPIC chart.  Gynecologic History No LMP recorded. Patient is postmenopausal. Contraception: abstinence and post menopausal status Last Pap: 07/2015. Results were: Negative/HPV HR neg Last mammogram: 03/2018. Results were: Negative Bone Density: Never Colonoscopy: 2012  Obstetric History OB History  Gravida Para Term Preterm AB Living  0 0 0 0 0 0  SAB TAB Ectopic Multiple Live Births  0 0 0 0 0     ROS: A ROS was performed and pertinent positives and negatives are included in the history.  GENERAL: No fevers or chills. HEENT: No change in vision, no earache, sore throat or sinus congestion. NECK: No pain or stiffness. CARDIOVASCULAR: No chest pain or pressure. No palpitations. PULMONARY: No shortness of breath, cough or wheeze. GASTROINTESTINAL: No abdominal pain, nausea, vomiting or diarrhea, melena or bright red blood per rectum. GENITOURINARY: No urinary frequency, urgency, hesitancy or dysuria. MUSCULOSKELETAL: No joint or muscle pain, no back pain, no recent trauma. DERMATOLOGIC: No rash, no itching, no lesions. ENDOCRINE: No polyuria, polydipsia, no heat or cold intolerance. No recent change in weight. HEMATOLOGICAL: No anemia or easy bruising or bleeding. NEUROLOGIC: No headache, seizures, numbness, tingling or weakness. PSYCHIATRIC: No depression, no loss of interest in normal activity or change in sleep pattern.     Exam:   BP 140/90   Ht 5\' 1"  (1.549 m)   Wt 149 lb 12.8 oz (67.9 kg)   BMI 28.30 kg/m   Body mass index is 28.3 kg/m.  General appearance :  Well developed well nourished female. No acute distress HEENT: Eyes: no retinal hemorrhage or exudates,  Neck supple, trachea midline, no carotid bruits, no thyroidmegaly Lungs: Clear to auscultation, no rhonchi or wheezes, or rib retractions  Heart: Regular rate and rhythm, no murmurs or gallops Breast:Examined in sitting and supine position were symmetrical in appearance, no palpable masses or tenderness,  no skin retraction, no nipple inversion, no nipple discharge, no skin discoloration, no axillary or supraclavicular lymphadenopathy Abdomen: no palpable masses or tenderness, no rebound or guarding Extremities: no edema or skin discoloration or tenderness  Pelvic: Vulva: Normal             Vagina: No gross lesions or discharge  Cervix: No gross lesions or discharge.  Pap reflex done.  Uterus  AV, normal size, shape and consistency, non-tender and mobile  Adnexa  Without masses or tenderness  Anus: Normal   Assessment/Plan:  62 y.o. female for annual exam   1. Encounter for routine gynecological examination with Papanicolaou smear of cervix Normal gynecologic exam.  Pap reflex done.  Breast exam normal.  Screening mammogram August 2019 was negative.  Colonoscopy in 2012.  Health labs with family physician.  Followed for ocular sarcoidosis.  2. Postmenopausal Well on no hormone replacement therapy.  No postmenopausal bleeding.  3. Screening for osteoporosis Will schedule bone density here now.  Recommend vitamin D supplements, calcium intake of 1500 mg/day and regular weightbearing physical activities. - Bone Density  4. Sarcoidosis Ocular sarcoidosis.  Management with rheumatologist and ophthalmologist.  Other orders - methotrexate (RHEUMATREX) 2.5 MG tablet; Take 2.5 mg by mouth once a week. Caution:Chemotherapy. Protect from light.  Princess Bruins MD, 4:03 PM 08/15/2018

## 2018-08-21 ENCOUNTER — Encounter: Payer: Self-pay | Admitting: Obstetrics & Gynecology

## 2018-08-21 LAB — PAP IG W/ RFLX HPV ASCU

## 2018-08-21 NOTE — Patient Instructions (Signed)
1. Encounter for routine gynecological examination with Papanicolaou smear of cervix Normal gynecologic exam.  Pap reflex done.  Breast exam normal.  Screening mammogram August 2019 was negative.  Colonoscopy in 2012.  Health labs with family physician.  Followed for ocular sarcoidosis.  2. Postmenopausal Well on no hormone replacement therapy.  No postmenopausal bleeding.  3. Screening for osteoporosis Will schedule bone density here now.  Recommend vitamin D supplements, calcium intake of 1500 mg/day and regular weightbearing physical activities. - Bone Density  4. Sarcoidosis Ocular sarcoidosis.  Management with rheumatologist and ophthalmologist.  Other orders - methotrexate (RHEUMATREX) 2.5 MG tablet; Take 2.5 mg by mouth once a week. Caution:Chemotherapy. Protect from light.  Dominic, it was a pleasure seeing you today!  I will inform you of your results as soon as they are available.

## 2018-12-23 ENCOUNTER — Other Ambulatory Visit: Payer: Self-pay

## 2018-12-23 ENCOUNTER — Ambulatory Visit: Payer: BLUE CROSS/BLUE SHIELD | Admitting: Sports Medicine

## 2018-12-23 ENCOUNTER — Encounter: Payer: Self-pay | Admitting: Sports Medicine

## 2018-12-23 VITALS — Temp 97.7°F

## 2018-12-23 DIAGNOSIS — M79671 Pain in right foot: Secondary | ICD-10-CM

## 2018-12-23 DIAGNOSIS — L84 Corns and callosities: Secondary | ICD-10-CM

## 2018-12-23 DIAGNOSIS — M2041 Other hammer toe(s) (acquired), right foot: Secondary | ICD-10-CM

## 2018-12-23 DIAGNOSIS — M7751 Other enthesopathy of right foot: Secondary | ICD-10-CM | POA: Diagnosis not present

## 2018-12-23 DIAGNOSIS — M779 Enthesopathy, unspecified: Secondary | ICD-10-CM

## 2018-12-23 MED ORDER — TRIAMCINOLONE ACETONIDE 10 MG/ML IJ SUSP
10.0000 mg | Freq: Once | INTRAMUSCULAR | Status: AC
  Administered 2018-12-23: 20:00:00 10 mg

## 2018-12-23 NOTE — Progress Notes (Signed)
Subjective: Jordan Miles is a 63 y.o. female patient who presents to office for evaluation of Right foot pain. Patient complains of progressive pain especially over the last month in the Right 4th toes with a corn to the toe that is getting worse and dark over the last 2 weeks. Ranks pain 10/10 and is now interferring with daily activities. Patient has tried soaking and betadine with a little relief in symptoms. Patient denies any other pedal complaints.   Review of Systems  Musculoskeletal: Positive for joint pain.  All other systems reviewed and are negative.    Patient Active Problem List   Diagnosis Date Noted  . Myopia with presbyopia of both eyes 07/08/2018  . Ptosis, right 07/08/2018  . PVD (posterior vitreous detachment), left 07/08/2018  . Nuclear sclerotic cataract of both eyes 07/08/2018  . Drug therapy 02/25/2018  . Sarcoid 02/25/2018  . 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 Family history of colon cancer in mother 07/11/2016  . Family history of colonic polyps 07/11/2016  . Lipoma of back 06/29/2015  . Gastroesophageal reflux disease without esophagitis 08/04/2014  . Elevated blood pressure 05/06/2014  . Right hip pain 05/06/2014  . Acute bronchitis 02/12/2014  . Adjustment disorder with anxiety 02/12/2014  . Allergic rhinitis 02/12/2014  . Arm pain 02/12/2014  . External hemorrhoids 02/12/2014  . Keratoconjunctivitis sicca, not specified as Sjogren's 02/12/2014  . Leiomyoma of uterus 02/12/2014  . Overweight 02/12/2014  . Pain in joint of right shoulder region 02/12/2014  . Pure hypercholesterolemia 02/12/2014  . Symptomatic menopausal or female climacteric states 02/12/2014  . Tenosynovitis of thumb 02/12/2014    Current Outpatient Medications on File Prior to Visit  Medication Sig Dispense Refill  . Cholecalciferol (VITAMIN D3) 2000 UNITS capsule Take by mouth.    . fexofenadine (ALLEGRA) 180 MG tablet Take 180 mg by mouth.    . fluticasone (FLONASE) 50 MCG/ACT nasal  spray 2 sprays by Each Nare route daily.    Marland Kitchen ibuprofen (ADVIL,MOTRIN) 800 MG tablet Take 800 mg by mouth.    . methotrexate (RHEUMATREX) 2.5 MG tablet Take 2.5 mg by mouth once a week. Caution:Chemotherapy. Protect from light. Pt takes 8 tablets every Monday once a week    . Multiple Vitamin (MULTI-VITAMINS) TABS Take by mouth.    . Omega-3 1000 MG CAPS Take by mouth.    Marland Kitchen PREVIDENT 5000 BOOSTER PLUS 1.1 % PSTE     . vitamin B-12 (CYANOCOBALAMIN) 100 MCG tablet Take by mouth.     No current facility-administered medications on file prior to visit.     No Known Allergies  Objective:  General: Alert and oriented x3 in no acute distress  Dermatology: Small hyperkeratotic lesion overlying 3-4 toes at PIPJ dorsally bilateral with the right 4th toes thick and discolored. No open lesions bilateral lower extremities, no webspace macerations, no ecchymosis bilateral, all nails x 10 are well manicured.  Vascular: Dorsalis Pedis and Posterior Tibial pedal pulses 1/4, Capillary Fill Time 3 seconds,(+) scant pedal hair growth bilateral, no edema bilateral lower extremities, Temperature gradient within normal limits.  Neurology: Johney Maine sensation intact via light touch bilateral.  Musculoskeletal: Semi-flexible hammertoes 3-4 with Mild tenderness with palpation at PIPJ Right>Left especially at 4th toe. +Pes planus. Strength within normal limits in all groups bilateral.   Xrays  Right Foot    Impression:Digital contracture, no other acute findings.        Assessment and Plan: Problem List Items Addressed This Visit    None  Visit Diagnoses    Hammertoe of right foot    -  Primary   Capsulitis       Relevant Medications   triamcinolone acetonide (KENALOG) 10 MG/ML injection 10 mg (Completed)   Corn of toe       Right foot pain           -Complete examination performed -Xrays reviewed -Discussed treatement options for painful right 4th hammertoe with corn -After oral consent and aseptic  prep, injected a mixture containing 0.5 ml of 2%  plain lidocaine, 0.5 ml 0.5% plain marcaine, 0.5 ml of kenalog 10 and 0.5 ml of dexamethasone phosphate to right 4th toe without complication. Post-injection care discussed with patient. -Pared corn to right 4th toe using chisel blade without incident -Dispensed toe cap -Recommend good supportive shoes -Recommend to patient if pain continues to consider surgery to correct hammertoes; patient reports that she will consider later in the year once she has had eye surgery -Patient to return to office as needed or sooner if condition worsens.  Landis Martins, DPM

## 2019-04-28 ENCOUNTER — Other Ambulatory Visit: Payer: Self-pay | Admitting: Obstetrics & Gynecology

## 2019-04-28 DIAGNOSIS — Z1231 Encounter for screening mammogram for malignant neoplasm of breast: Secondary | ICD-10-CM

## 2019-06-12 ENCOUNTER — Ambulatory Visit
Admission: RE | Admit: 2019-06-12 | Discharge: 2019-06-12 | Disposition: A | Discharge status: Home / Self Care | Payer: BLUE CROSS/BLUE SHIELD | Source: Ambulatory Visit | Attending: Obstetrics & Gynecology | Admitting: Obstetrics & Gynecology

## 2019-06-12 ENCOUNTER — Other Ambulatory Visit: Payer: Self-pay

## 2019-06-12 DIAGNOSIS — Z1231 Encounter for screening mammogram for malignant neoplasm of breast: Secondary | ICD-10-CM

## 2019-08-18 ENCOUNTER — Encounter: Payer: BLUE CROSS/BLUE SHIELD | Admitting: Obstetrics & Gynecology

## 2019-08-26 ENCOUNTER — Other Ambulatory Visit: Payer: Self-pay

## 2019-08-27 ENCOUNTER — Encounter: Payer: Self-pay | Admitting: Obstetrics & Gynecology

## 2019-08-27 ENCOUNTER — Ambulatory Visit (INDEPENDENT_AMBULATORY_CARE_PROVIDER_SITE_OTHER): Payer: Medicare HMO | Admitting: Obstetrics & Gynecology

## 2019-08-27 VITALS — BP 154/80 | Ht 61.0 in | Wt 141.0 lb

## 2019-08-27 DIAGNOSIS — Z780 Asymptomatic menopausal state: Secondary | ICD-10-CM

## 2019-08-27 DIAGNOSIS — Z13820 Encounter for screening for osteoporosis: Secondary | ICD-10-CM

## 2019-08-27 DIAGNOSIS — D869 Sarcoidosis, unspecified: Secondary | ICD-10-CM

## 2019-08-27 DIAGNOSIS — Z01419 Encounter for gynecological examination (general) (routine) without abnormal findings: Secondary | ICD-10-CM

## 2019-08-27 NOTE — Progress Notes (Signed)
Jordan Miles 07-Jul-1956 [id number]   History:    63 y.o. G0 single  RP:  Established patient presenting for annual gyn exam   HPI: Menopause, well on no HRT.  No PMB.  No pelvic pain.  Abstinent.  Pap 07/2018 Negative.  Urine/BMs wnl.  Breasts normal.  Screening mammo negative 05/2019.  BMI 26.64.  Sarcoidosis Rt eye, surgery successful.  Health labs with family physician.  Colono 2018, every 5 yrs.   Past medical history,surgical history, family history and social history were all reviewed and documented in the EPIC chart.  Gynecologic History No LMP recorded. Patient is postmenopausal.  Obstetric History OB History  Gravida Para Term Preterm AB Living  0 0 0 0 0 0  SAB TAB Ectopic Multiple Live Births  0 0 0 0 0     ROS: A ROS was performed and pertinent positives and negatives are included in the history.  GENERAL: No fevers or chills. HEENT: No change in vision, no earache, sore throat or sinus congestion. NECK: No pain or stiffness. CARDIOVASCULAR: No chest pain or pressure. No palpitations. PULMONARY: No shortness of breath, cough or wheeze. GASTROINTESTINAL: No abdominal pain, nausea, vomiting or diarrhea, melena or bright red blood per rectum. GENITOURINARY: No urinary frequency, urgency, hesitancy or dysuria. MUSCULOSKELETAL: No joint or muscle pain, no back pain, no recent trauma. DERMATOLOGIC: No rash, no itching, no lesions. ENDOCRINE: No polyuria, polydipsia, no heat or cold intolerance. No recent change in weight. HEMATOLOGICAL: No anemia or easy bruising or bleeding. NEUROLOGIC: No headache, seizures, numbness, tingling or weakness. PSYCHIATRIC: No depression, no loss of interest in normal activity or change in sleep pattern.     Exam:   BP (!) 154/80 (BP Location: Right Arm, Patient Position: Sitting, Cuff Size: Normal)   Ht 5\' 1"  (1.549 m)   Wt 141 lb (64 kg)   BMI 26.64 kg/m   Body mass index is 26.64 kg/m.  General appearance : Well developed well  nourished female. No acute distress HEENT: Eyes: no retinal hemorrhage or exudates,  Neck supple, trachea midline, no carotid bruits, no thyroidmegaly Lungs: Clear to auscultation, no rhonchi or wheezes, or rib retractions  Heart: Regular rate and rhythm, no murmurs or gallops Breast:Examined in sitting and supine position were symmetrical in appearance, no palpable masses or tenderness,  no skin retraction, no nipple inversion, no nipple discharge, no skin discoloration, no axillary or supraclavicular lymphadenopathy Abdomen: no palpable masses or tenderness, no rebound or guarding Extremities: no edema or skin discoloration or tenderness  Pelvic: Vulva: Normal             Vagina: No gross lesions or discharge  Cervix: No gross lesions or discharge  Uterus  AV, stable size, shape and consistency, non-tender and mobile  Adnexa  Without masses or tenderness  Anus: Normal   Assessment/Plan:  63 y.o. female for annual exam   1. Encounter for routine gynecological examination with Papanicolaou smear of cervix Normal gynecologic exam in menopause.  Pap test December 2019 was negative, no indication to repeat this year.  Breast exam normal.  Screening mammogram October 2020 was negative.  Colonoscopy 2018, on a 5-year schedule.  Health labs with family physician.  Good body mass index at 26.64.  Continue with healthy nutrition and fitness.  2. Postmenopausal Well on no hormone replacement therapy.  No postmenopausal bleeding.  3. Screening for osteoporosis We will schedule a bone density here now.  Vitamin D supplements, calcium intake of 1200 mg  daily and regular weightbearing physical activity is recommended. - DG Bone Density; Future  4. Sarcoidosis Sarcoidosis in right eye with successful surgery.  Princess Bruins MD, 4:03 PM 08/27/2019

## 2019-08-30 ENCOUNTER — Encounter: Payer: Self-pay | Admitting: Obstetrics & Gynecology

## 2019-08-30 NOTE — Patient Instructions (Signed)
1. Encounter for routine gynecological examination with Papanicolaou smear of cervix Normal gynecologic exam in menopause.  Pap test December 2019 was negative, no indication to repeat this year.  Breast exam normal.  Screening mammogram October 2020 was negative.  Colonoscopy 2018, on a 5-year schedule.  Health labs with family physician.  Good body mass index at 26.64.  Continue with healthy nutrition and fitness.  2. Postmenopausal Well on no hormone replacement therapy.  No postmenopausal bleeding.  3. Screening for osteoporosis We will schedule a bone density here now.  Vitamin D supplements, calcium intake of 1200 mg daily and regular weightbearing physical activity is recommended. - DG Bone Density; Future  4. Sarcoidosis Sarcoidosis in right eye with successful surgery.  Jordan Miles, it was a pleasure seeing you today!

## 2019-09-22 ENCOUNTER — Other Ambulatory Visit: Payer: Self-pay

## 2019-09-22 ENCOUNTER — Ambulatory Visit: Payer: Medicare HMO | Admitting: Sports Medicine

## 2019-09-22 ENCOUNTER — Encounter: Payer: Self-pay | Admitting: Sports Medicine

## 2019-09-22 VITALS — Temp 97.7°F

## 2019-09-22 DIAGNOSIS — L84 Corns and callosities: Secondary | ICD-10-CM | POA: Diagnosis not present

## 2019-09-22 DIAGNOSIS — M79671 Pain in right foot: Secondary | ICD-10-CM

## 2019-09-22 DIAGNOSIS — M779 Enthesopathy, unspecified: Secondary | ICD-10-CM | POA: Diagnosis not present

## 2019-09-22 DIAGNOSIS — M2041 Other hammer toe(s) (acquired), right foot: Secondary | ICD-10-CM | POA: Diagnosis not present

## 2019-09-22 MED ORDER — TRIAMCINOLONE ACETONIDE 10 MG/ML IJ SUSP
10.0000 mg | Freq: Once | INTRAMUSCULAR | Status: AC
  Administered 2019-09-22: 10 mg

## 2019-09-22 NOTE — Progress Notes (Signed)
Subjective: Jordan Miles is a 64 y.o. female patient who presents to office for follow up evaluation of Right foot pain. Patient reports that pain is back at the Right 4th>3rd toes with a corn to the toe that is getting worse, hard to fit shoes because of rubbing, Ranks pain 10/10 and is now interferring with daily activities. Patient denies any other pedal complaints.   Patient Active Problem List   Diagnosis Date Noted  . Myopia with presbyopia of both eyes 07/08/2018  . Ptosis, right 07/08/2018  . PVD (posterior vitreous detachment), left 07/08/2018  . Nuclear sclerotic cataract of both eyes 07/08/2018  . Drug therapy 02/25/2018  . Sarcoid 02/25/2018  . 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 Family history of colon cancer in mother 07/11/2016  . Family history of colonic polyps 07/11/2016  . Lipoma of back 06/29/2015  . Gastroesophageal reflux disease without esophagitis 08/04/2014  . Elevated blood pressure 05/06/2014  . Right hip pain 05/06/2014  . Acute bronchitis 02/12/2014  . Adjustment disorder with anxiety 02/12/2014  . Allergic rhinitis 02/12/2014  . Arm pain 02/12/2014  . External hemorrhoids 02/12/2014  . Keratoconjunctivitis sicca, not specified as Sjogren's 02/12/2014  . Leiomyoma of uterus 02/12/2014  . Overweight 02/12/2014  . Pain in joint of right shoulder region 02/12/2014  . Pure hypercholesterolemia 02/12/2014  . Symptomatic menopausal or female climacteric states 02/12/2014  . Tenosynovitis of thumb 02/12/2014    Current Outpatient Medications on File Prior to Visit  Medication Sig Dispense Refill  . Cholecalciferol (VITAMIN D3) 2000 UNITS capsule Take by mouth.    . fexofenadine (ALLEGRA) 180 MG tablet Take 180 mg by mouth.    . fluticasone (FLONASE) 50 MCG/ACT nasal spray 2 sprays by Each Nare route daily.    Marland Kitchen ibuprofen (ADVIL,MOTRIN) 800 MG tablet Take 800 mg by mouth.    . methotrexate (RHEUMATREX) 2.5 MG tablet Take 2.5 mg by mouth once a week.  Caution:Chemotherapy. Protect from light. Pt takes 3 tablets every Monday once a week    . Multiple Vitamin (MULTI-VITAMINS) TABS Take by mouth.    . Omega-3 1000 MG CAPS Take by mouth.    Marland Kitchen PREVIDENT 5000 BOOSTER PLUS 1.1 % PSTE     . vitamin B-12 (CYANOCOBALAMIN) 100 MCG tablet Take by mouth.     No current facility-administered medications on file prior to visit.    No Known Allergies  Objective:  General: Alert and oriented x3 in no acute distress  Dermatology: Small hyperkeratotic lesion overlying 3-4 toes at PIPJ dorsally bilateral with the right 4th toes thick and discolored. No open lesions bilateral lower extremities, no webspace macerations, no ecchymosis bilateral, all nails x 10 are well manicured.  Vascular: Dorsalis Pedis and Posterior Tibial pedal pulses 1/4, Capillary Fill Time 3 seconds,(+) scant pedal hair growth bilateral, no edema bilateral lower extremities, Temperature gradient within normal limits.  Neurology: Johney Maine sensation intact via light touch bilateral.  Musculoskeletal: Semi-flexible hammertoes 3-4 with Mild tenderness with palpation at PIPJ Right>Left especially at 4th toe. +Pes planus. Strength within normal limits in all groups bilateral.        Assessment and Plan: Problem List Items Addressed This Visit    None    Visit Diagnoses    Hammertoe of right foot    -  Primary   Capsulitis       Corn of toe       Right foot pain           -Complete examination performed -  Xrays reviewed -Discussed treatement options for painful right 4th>3rd hammertoe with corn -After oral consent and aseptic prep, injected a mixture containing 0.5 ml of 2%  plain lidocaine, 0.5 ml 0.5% plain marcaine, 0.5 ml of kenalog 10 and 0.5 ml of dexamethasone phosphate to right 4th toe without complication. Post-injection care discussed with patient. -Pared corn to right 4th toe using chisel blade without incident -Re-Dispensed toe cap -Recommend good supportive  shoes -Recommend to patient if pain continues to consider surgery to correct hammertoes;patient wants to wait until spring  -Patient to return to office as needed or sooner if condition worsens.  Landis Martins, DPM

## 2020-04-26 ENCOUNTER — Telehealth: Payer: Self-pay | Admitting: *Deleted

## 2020-04-26 DIAGNOSIS — D219 Benign neoplasm of connective and other soft tissue, unspecified: Secondary | ICD-10-CM

## 2020-04-26 NOTE — Telephone Encounter (Addendum)
Patient called had a CT scan abdomen/pelvis done on 04/18/20, ordered by Smiley showed she has a fibroid was told to follow up with GYN for the next step. Patient has been having discomfort. Please advise

## 2020-04-27 NOTE — Telephone Encounter (Signed)
Order placed for ultrasound, left detailed message on home # to call and schedule ultrasound.

## 2020-04-27 NOTE — Telephone Encounter (Signed)
Bring in for a Pelvic US to get a better assessment and counseling on Fibroids in postmenopause.

## 2020-04-28 ENCOUNTER — Ambulatory Visit (INDEPENDENT_AMBULATORY_CARE_PROVIDER_SITE_OTHER): Payer: Medicare HMO | Admitting: Obstetrics & Gynecology

## 2020-04-28 ENCOUNTER — Ambulatory Visit (INDEPENDENT_AMBULATORY_CARE_PROVIDER_SITE_OTHER): Payer: Medicare HMO

## 2020-04-28 ENCOUNTER — Other Ambulatory Visit: Payer: Self-pay

## 2020-04-28 DIAGNOSIS — D219 Benign neoplasm of connective and other soft tissue, unspecified: Secondary | ICD-10-CM

## 2020-04-28 NOTE — Progress Notes (Signed)
    Jordan Miles [birth year] [id number]        64 y.o.  G0  RP: Fibroids on recent CT Scan for Pelvic US  HPI: No abdominopelvic pain currently.  Had an episode of left pelvic pain which was associated with diverticulitis for which she received antibiotics.  Improved on antibiotics.  Followed by gastro.  No vaginal bleeding.  Known Fibroids.   OB History  Gravida Para Term Preterm AB Living  0 0 0 0 0 0  SAB TAB Ectopic Multiple Live Births  0 0 0 0 0    Past medical history,surgical history, problem list, medications, allergies, family history and social history were all reviewed and documented in the EPIC chart.   Directed ROS with pertinent positives and negatives documented in the history of present illness/assessment and plan.  Exam:  There were no vitals filed for this visit. General appearance:  Normal  CT Scan of Pelvis 03/19/2020:  Reproductive: Uterus is diffusely enlarged and heterogeneous in  appearance with multiple heterogeneously enhancing lesions within  the uterus, presumably multifocal fibroids. The largest of these is  exophytic extending off the posterior aspect of the lower uterine  segment measuring 4.3 x 5.0 x 3.6 cm (axial image 64 of series 2 and  sagittal image 79 of series 4). Ovaries are unremarkable in  appearance.     Pelvic US today: T/V images.  Anteverted uterus enlarged with multiple fibroids, the largest measured at 5.8 cm.  The overall uterine size is measured at 8.48 x 8.37 x 6.88 cm.  The endometrial lining is thin and symmetrical measured at 0.9 mm.  The cavity is distorted at the fundus due to fibroids.  Both ovaries are atrophic.  No adnexal mass seen.  No free fluid in the posterior cul-de-sac.   Assessment/Plan:  64 y.o. G0  1. Fibroids Pelvic ultrasound findings thoroughly reviewed with patient.  Stable uterine fibroids.  Benign appearance.  Patient reassured.  Recent left pelvic pain probably due to diverticulitis for which she  received antibiotics with improvement on therapy.  Diverticulosis/Diverticulitis followed by gastro.  Princess Bruins MD, 5:03 PM 04/28/2020

## 2020-05-01 ENCOUNTER — Encounter: Payer: Self-pay | Admitting: Obstetrics & Gynecology

## 2020-06-02 ENCOUNTER — Other Ambulatory Visit: Payer: Self-pay | Admitting: Obstetrics & Gynecology

## 2020-06-02 DIAGNOSIS — Z1231 Encounter for screening mammogram for malignant neoplasm of breast: Secondary | ICD-10-CM

## 2020-07-15 ENCOUNTER — Other Ambulatory Visit: Payer: Self-pay

## 2020-07-15 ENCOUNTER — Ambulatory Visit
Admission: RE | Admit: 2020-07-15 | Discharge: 2020-07-15 | Disposition: A | Discharge status: Home / Self Care | Payer: Medicare HMO | Source: Ambulatory Visit | Attending: Obstetrics & Gynecology | Admitting: Obstetrics & Gynecology

## 2020-07-15 DIAGNOSIS — Z1231 Encounter for screening mammogram for malignant neoplasm of breast: Secondary | ICD-10-CM

## 2020-10-14 ENCOUNTER — Encounter: Payer: Medicare HMO | Admitting: Obstetrics & Gynecology

## 2020-11-11 ENCOUNTER — Encounter: Payer: Self-pay | Admitting: Obstetrics & Gynecology

## 2020-11-11 ENCOUNTER — Other Ambulatory Visit: Payer: Self-pay

## 2020-11-11 ENCOUNTER — Ambulatory Visit (INDEPENDENT_AMBULATORY_CARE_PROVIDER_SITE_OTHER): Payer: Medicare HMO | Admitting: Obstetrics & Gynecology

## 2020-11-11 VITALS — BP 142/88 | Ht 61.0 in | Wt 138.0 lb

## 2020-11-11 DIAGNOSIS — Z01419 Encounter for gynecological examination (general) (routine) without abnormal findings: Secondary | ICD-10-CM | POA: Diagnosis not present

## 2020-11-11 DIAGNOSIS — Z780 Asymptomatic menopausal state: Secondary | ICD-10-CM

## 2020-11-11 NOTE — Progress Notes (Signed)
    Jordan Miles 1956-04-06 [id number]   History:    65 y.o. G0 single  MC:[id number] presenting for annual gyn exam   YTK:[id number], well on no HRT. No PMB. No pelvic pain. Abstinent.  Pap 07/2018 Negative. Urine/BMs wnl. Breasts normal.  Screening mammo negative 06/2020. BMI 26.07. Sarcoidosis Rt eye, surgery successful.Health labs with family physician.  Colono 2018, every 5 yrs.  Past medical history,surgical history, family history and social history were all reviewed and documented in the EPIC chart.  Gynecologic History No LMP recorded. Patient is postmenopausal.  Obstetric History OB History  Gravida Para Term Preterm AB Living  0 0 0 0 0 0  SAB IAB Ectopic Multiple Live Births  0 0 0 0 0     ROS: A ROS was performed and pertinent positives and negatives are included in the history.  GENERAL: No fevers or chills. HEENT: No change in vision, no earache, sore throat or sinus congestion. NECK: No pain or stiffness. CARDIOVASCULAR: No chest pain or pressure. No palpitations. PULMONARY: No shortness of breath, cough or wheeze. GASTROINTESTINAL: No abdominal pain, nausea, vomiting or diarrhea, melena or bright red blood per rectum. GENITOURINARY: No urinary frequency, urgency, hesitancy or dysuria. MUSCULOSKELETAL: No joint or muscle pain, no back pain, no recent trauma. DERMATOLOGIC: No rash, no itching, no lesions. ENDOCRINE: No polyuria, polydipsia, no heat or cold intolerance. No recent change in weight. HEMATOLOGICAL: No anemia or easy bruising or bleeding. NEUROLOGIC: No headache, seizures, numbness, tingling or weakness. PSYCHIATRIC: No depression, no loss of interest in normal activity or change in sleep pattern.     Exam:   BP (!) 142/88   Ht 5\' 1"  (1.549 m)   Wt 138 lb (62.6 kg)   BMI 26.07 kg/m   Body mass index is 26.07 kg/m.  General appearance : Well developed well nourished female. No acute distress HEENT: Eyes: no retinal  hemorrhage or exudates,  Neck supple, trachea midline, no carotid bruits, no thyroidmegaly Lungs: Clear to auscultation, no rhonchi or wheezes, or rib retractions  Heart: Regular rate and rhythm, no murmurs or gallops Breast:Examined in sitting and supine position were symmetrical in appearance, no palpable masses or tenderness,  no skin retraction, no nipple inversion, no nipple discharge, no skin discoloration, no axillary or supraclavicular lymphadenopathy Abdomen: no palpable masses or tenderness, no rebound or guarding Extremities: no edema or skin discoloration or tenderness  Pelvic: Vulva: Normal             Vagina: No gross lesions or discharge  Cervix: No gross lesions or discharge.  Pap reflex done.  Uterus  AV, normal size, shape and consistency, non-tender and mobile  Adnexa  Without masses or tenderness  Anus: Normal   Assessment/Plan:  65 y.o. female for annual exam   1. Encounter for routine gynecological examination with Papanicolaou smear of cervix Normal gynecologic exam in menopause.  Pap reflex done.  Breasts normal.  Screening mammo neg 06/2020.  Colono 2018, q5 years.  Health labs with Fam MD.  2. Postmenopausal Postmenopause, well on no HRT.  No PMB.  Declines BD.  Vit D supplements, Ca++ 1.5 g/d total, regular weight bearing physical activity.  Princess Bruins MD, 4:22 PM 11/11/2020

## 2020-11-16 LAB — PAP IG W/ RFLX HPV ASCU

## 2020-11-20 ENCOUNTER — Encounter: Payer: Self-pay | Admitting: Obstetrics & Gynecology

## 2021-06-07 ENCOUNTER — Other Ambulatory Visit: Payer: Self-pay | Admitting: Obstetrics & Gynecology

## 2021-06-07 DIAGNOSIS — Z1231 Encounter for screening mammogram for malignant neoplasm of breast: Secondary | ICD-10-CM

## 2021-07-28 ENCOUNTER — Ambulatory Visit: Payer: Medicare HMO

## 2021-11-13 ENCOUNTER — Encounter: Payer: Self-pay | Admitting: Obstetrics & Gynecology

## 2021-12-15 ENCOUNTER — Ambulatory Visit: Payer: Medicare HMO | Admitting: Obstetrics & Gynecology

## 2021-12-20 LAB — HM COLONOSCOPY

## 2022-01-04 ENCOUNTER — Ambulatory Visit: Payer: Medicare HMO | Admitting: Sports Medicine

## 2022-01-04 DIAGNOSIS — Q742 Other congenital malformations of lower limb(s), including pelvic girdle: Secondary | ICD-10-CM | POA: Diagnosis not present

## 2022-01-04 DIAGNOSIS — M79674 Pain in right toe(s): Secondary | ICD-10-CM | POA: Diagnosis not present

## 2022-01-04 DIAGNOSIS — L603 Nail dystrophy: Secondary | ICD-10-CM

## 2022-01-04 DIAGNOSIS — M2041 Other hammer toe(s) (acquired), right foot: Secondary | ICD-10-CM

## 2022-01-04 DIAGNOSIS — M21619 Bunion of unspecified foot: Secondary | ICD-10-CM

## 2022-01-04 DIAGNOSIS — M2042 Other hammer toe(s) (acquired), left foot: Secondary | ICD-10-CM

## 2022-01-04 NOTE — Patient Instructions (Signed)
Apply vicks vapor rub to your right 2nd toenail twice a week at bedtime. Apply a thin layer and cover with a sock until thickness and discoloration improves ?Use a nail file once a week to file your right 2nd toenail to prevent it from growing back out so thick ?

## 2022-01-04 NOTE — Progress Notes (Signed)
Subjective: ?Jordan Miles is a 66 y.o. female patient seen today in office with complaint of mildly painful thick right 2nd toenail; feels like it grows in.   Denies any significant redness warmth swelling or drainage or any constitutional symptoms. ? ?Patient Active Problem List  ? Diagnosis Date Noted  ? Myopia with presbyopia of both eyes 07/08/2018  ? Ptosis, right 07/08/2018  ? PVD (posterior vitreous detachment), left 07/08/2018  ? Nuclear sclerotic cataract of both eyes 07/08/2018  ? Drug therapy 02/25/2018  ? Sarcoid 02/25/2018  ? 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 Family history of colon cancer in mother 07/11/2016  ? Family history of colonic polyps 07/11/2016  ? Lipoma of back 06/29/2015  ? Gastroesophageal reflux disease without esophagitis 08/04/2014  ? Elevated blood pressure 05/06/2014  ? Right hip pain 05/06/2014  ? Acute bronchitis 02/12/2014  ? Adjustment disorder with anxiety 02/12/2014  ? Allergic rhinitis 02/12/2014  ? Arm pain 02/12/2014  ? External hemorrhoids 02/12/2014  ? Keratoconjunctivitis sicca, not specified as Sjogren's 02/12/2014  ? Leiomyoma of uterus 02/12/2014  ? Overweight 02/12/2014  ? Pain in joint of right shoulder region 02/12/2014  ? Pure hypercholesterolemia 02/12/2014  ? Symptomatic menopausal or female climacteric states 02/12/2014  ? Tenosynovitis of thumb 02/12/2014  ? ? ?Current Outpatient Medications on File Prior to Visit  ?Medication Sig Dispense Refill  ? cyclobenzaprine (FLEXERIL) 10 MG tablet TAKE ONE TABLET BY MOUTH EVERY 8 HOURS AS NEEDED FOR MUSCLE SPASM OR FOR MUSCLE TIGHTNESS    ? fexofenadine (ALLEGRA) 180 MG tablet Take by mouth.    ? fluticasone (FLONASE) 50 MCG/ACT nasal spray Place into the nose.    ? folic acid (FOLVITE) 1 MG tablet Take 1 tablet by mouth daily.    ? Carboxymethylcellulose Sodium 0.25 % SOLN Apply to eye.    ? Cholecalciferol 50 MCG (2000 UT) CAPS Take by mouth.    ? cyanocobalamin 100 MCG tablet Take by mouth.    ? ibuprofen (ADVIL,MOTRIN)  800 MG tablet Take 800 mg by mouth.    ? Lactobacillus Rhamnosus, GG, (CULTURELLE) CAPS Take by mouth.    ? methotrexate (RHEUMATREX) 2.5 MG tablet Take 2.5 mg by mouth once a week. Caution:Chemotherapy. Protect from light. Pt takes 5 tablets every Monday once a week    ? Multiple Vitamin (MULTI-VITAMINS) TABS Take by mouth.    ? Omega-3 1000 MG CAPS Take by mouth.    ? PREVIDENT 5000 BOOSTER PLUS 1.1 % PSTE     ? zolpidem (AMBIEN) 5 MG tablet Take 5 mg by mouth at bedtime as needed.    ? ?No current facility-administered medications on file prior to visit.  ? ? ?No Known Allergies ? ?Objective: ?Physical Exam ? ?General: Well developed, nourished, no acute distress, awake, alert and oriented x 3 ? ?Vascular: Dorsalis pedis artery 2/4 bilateral, Posterior tibial artery 1/4 bilateral, skin temperature warm to warm proximal to distal bilateral lower extremities, no varicosities, pedal hair present bilateral.   ? ?Neurological: Gross sensation present via light touch bilateral.  ? ?Dermatological: Skin is warm, dry, and supple bilateral, right 2nd toenail is tender, long, thick, and discolored with mild subungal debris, no webspace macerations present bilateral, no open lesions present bilateral, no callus/corns/hyperkeratotic tissue present bilateral. No signs of infection bilateral. ? ?Musculoskeletal: Asymptomatic long toe, hammertoe, bunion boney deformities noted bilateral R>L. Muscular strength within normal limits without painon range of motion. No pain with calf compression bilateral. ? ?Assessment and Plan:  ?Problem List Items  Addressed This Visit   ?None ?Visit Diagnoses   ? ? Nail dystrophy    -  Primary  ? possible onychomycosis   ? Pain of toe of right foot      ? Long toe      ? Bunion      ? Hammer toes of both feet      ? ?  ? ? ?-Examined patient.  ?-Discussed treatment options for painful right second toenail. ?-Mechanically debrided and reduced right second toenail with sterile nail nipper and  dremel nail file without incident. ?-Advised patient to use vicks vapor rub to help with thickness of the nail and to help deter fungus in the nail ?-Recommend wider shoes that do not crowd or rub toes ?-Dispensed toe spacer to use as directed at 1st webspace on right ?-Patient to return PRN.  ?Landis Martins, DPM ? ?

## 2022-01-26 ENCOUNTER — Ambulatory Visit (INDEPENDENT_AMBULATORY_CARE_PROVIDER_SITE_OTHER): Payer: Medicare HMO | Admitting: Obstetrics & Gynecology

## 2022-01-26 ENCOUNTER — Encounter: Payer: Self-pay | Admitting: Obstetrics & Gynecology

## 2022-01-26 VITALS — BP 114/70 | HR 92 | Ht 60.5 in | Wt 133.0 lb

## 2022-01-26 DIAGNOSIS — Z13820 Encounter for screening for osteoporosis: Secondary | ICD-10-CM

## 2022-01-26 DIAGNOSIS — Z9189 Other specified personal risk factors, not elsewhere classified: Secondary | ICD-10-CM

## 2022-01-26 DIAGNOSIS — Z780 Asymptomatic menopausal state: Secondary | ICD-10-CM

## 2022-01-26 DIAGNOSIS — Z01419 Encounter for gynecological examination (general) (routine) without abnormal findings: Secondary | ICD-10-CM

## 2022-01-26 NOTE — Progress Notes (Signed)
Jordan Miles 1955-09-24 [id number]   History:    66 y.o. G0 single   RP:  Established patient presenting for annual gyn exam    HPI: Postmenopause, well on no HRT.  No PMB.  No pelvic pain.  Abstinent.  Pap 10/2020 Negative.  Urine/BMs wnl.  Breasts normal.  Screening mammo negative 10/2021.  BMI 25.55.  Physically active. Sarcoidosis Rt eye, surgery successful.  Health labs with family physician.  COLONOSCOPY: 12-20-2021  Past medical history,surgical history, family history and social history were all reviewed and documented in the EPIC chart.  Gynecologic History No LMP recorded. Patient is postmenopausal.  Obstetric History OB History  Gravida Para Term Preterm AB Living  0 0 0 0 0 0  SAB IAB Ectopic Multiple Live Births  0 0 0 0 0     ROS: A ROS was performed and pertinent positives and negatives are included in the history.  GENERAL: No fevers or chills. HEENT: No change in vision, no earache, sore throat or sinus congestion. NECK: No pain or stiffness. CARDIOVASCULAR: No chest pain or pressure. No palpitations. PULMONARY: No shortness of breath, cough or wheeze. GASTROINTESTINAL: No abdominal pain, nausea, vomiting or diarrhea, melena or bright red blood per rectum. GENITOURINARY: No urinary frequency, urgency, hesitancy or dysuria. MUSCULOSKELETAL: No joint or muscle pain, no back pain, no recent trauma. DERMATOLOGIC: No rash, no itching, no lesions. ENDOCRINE: No polyuria, polydipsia, no heat or cold intolerance. No recent change in weight. HEMATOLOGICAL: No anemia or easy bruising or bleeding. NEUROLOGIC: No headache, seizures, numbness, tingling or weakness. PSYCHIATRIC: No depression, no loss of interest in normal activity or change in sleep pattern.     Exam:   BP 114/70   Pulse 92   Ht 5' 0.5" (1.537 m)   Wt 133 lb (60.3 kg)   BMI 25.55 kg/m   Body mass index is 25.55 kg/m.  General appearance : Well developed well nourished female. No acute distress HEENT:  Eyes: no retinal hemorrhage or exudates,  Neck supple, trachea midline, no carotid bruits, no thyroidmegaly Lungs: Clear to auscultation, no rhonchi or wheezes, or rib retractions  Heart: Regular rate and rhythm, no murmurs or gallops Breast:Examined in sitting and supine position were symmetrical in appearance, no palpable masses or tenderness,  no skin retraction, no nipple inversion, no nipple discharge, no skin discoloration, no axillary or supraclavicular lymphadenopathy Abdomen: no palpable masses or tenderness, no rebound or guarding Extremities: no edema or skin discoloration or tenderness  Pelvic: Vulva: Normal             Vagina: No gross lesions or discharge  Cervix: No gross lesions or discharge  Uterus  AV, normal size, shape and consistency, non-tender and mobile  Adnexa  Without masses or tenderness  Anus: Normal   Assessment/Plan:  66 y.o. female for annual exam   1. Well female exam with routine gynecological exam Postmenopause, well on no HRT.  No PMB.  No pelvic pain.  Abstinent.  Pap 10/2020 Negative.  Urine/BMs wnl.  Breasts normal.  Screening mammo negative 10/2021.  BMI 25.55.  Physically active. Sarcoidosis Rt eye, surgery successful.  Health labs with family physician.  COLONOSCOPY: 12-20-2021  2. Postmenopausal Postmenopause, well on no HRT.  No PMB.  No pelvic pain.  Abstinent.  3. Screening for osteoporosis Vit D supplement, Ca++ total 1.5 g/d, regular weight bearing activities.  Will schedule BD here now. - DG Bone Density; Future  4. Other specified personal risk factors, not  elsewhere classified   Princess Bruins MD, 3:30 PM 01/26/2022

## 2022-03-09 LAB — HM DEXA SCAN

## 2022-04-25 ENCOUNTER — Ambulatory Visit: Payer: Medicare HMO | Admitting: Podiatry

## 2022-05-18 ENCOUNTER — Ambulatory Visit: Payer: Medicare HMO | Admitting: Podiatry

## 2022-05-18 DIAGNOSIS — M79674 Pain in right toe(s): Secondary | ICD-10-CM

## 2022-05-18 DIAGNOSIS — B351 Tinea unguium: Secondary | ICD-10-CM | POA: Diagnosis not present

## 2022-05-18 DIAGNOSIS — M79675 Pain in left toe(s): Secondary | ICD-10-CM

## 2022-05-18 NOTE — Progress Notes (Unsigned)
  Subjective:  Patient ID: Jordan Miles, female    DOB: 02/02/1956,  MRN: [id number]  Chief Complaint  Patient presents with   Nail Problem    Nail split    66 y.o. female returns for the above complaint.  Patient presents with thickened elongated dystrophic toenails x10 mild pain on palpation.  Patient would like for me to debride down she is not able to do it herself she denies any other acute complaints.  Hurts with ambulation  Objective:  There were no vitals filed for this visit. Podiatric Exam: Vascular: dorsalis pedis and posterior tibial pulses are palpable bilateral. Capillary return is immediate. Temperature gradient is WNL. Skin turgor WNL  Sensorium: Normal Semmes Weinstein monofilament test. Normal tactile sensation bilaterally. Nail Exam: Pt has thick disfigured discolored nails with subungual debris noted bilateral entire nail hallux through fifth toenails.  Pain on palpation to the nails. Ulcer Exam: There is no evidence of ulcer or pre-ulcerative changes or infection. Orthopedic Exam: Muscle tone and strength are WNL. No limitations in general ROM. No crepitus or effusions noted.  Skin: No Porokeratosis. No infection or ulcers    Assessment & Plan:   1. Pain due to onychomycosis of toenails of both feet     Patient was evaluated and treated and all questions answered.  Onychomycosis with pain  -Nails palliatively debrided as below. -Educated on self-care  Procedure: Nail Debridement Rationale: pain  Type of Debridement: manual, sharp debridement. Instrumentation: Nail nipper, rotary burr. Number of Nails: 10  Procedures and Treatment: Consent by patient was obtained for treatment procedures. The patient understood the discussion of treatment and procedures well. All questions were answered thoroughly reviewed. Debridement of mycotic and hypertrophic toenails, 1 through 5 bilateral and clearing of subungual debris. No ulceration, no infection noted.  Return  Visit-Office Procedure: Patient instructed to return to the office for a follow up visit 3 months for continued evaluation and treatment.  Nicholes Rough, DPM    No follow-ups on file.

## 2022-12-14 ENCOUNTER — Ambulatory Visit: Payer: Medicare HMO | Admitting: Podiatry

## 2022-12-18 ENCOUNTER — Ambulatory Visit: Payer: Medicare Other | Admitting: Podiatry

## 2022-12-18 DIAGNOSIS — M2041 Other hammer toe(s) (acquired), right foot: Secondary | ICD-10-CM

## 2022-12-18 NOTE — Progress Notes (Signed)
Subjective:  Patient ID: Jordan Miles, female    DOB: 1955-11-18,  MRN: [id number]  Chief Complaint  Patient presents with   Toe Pain    Right foot 3rd toe pain    67 y.o. female presents with the above complaint.  Patient presents with complaints/pain of the right third digit.  She states that this hammertoes been present for quite some time she wanted discuss treatment options for it.  She has not seen and was prior to seeing me for this hammertoe procedure.  She states it hurts with ambulation worse with pressure surges causing her to make more pain.  She wanted to get it evaluated see if there is any other options.   Review of Systems: Negative except as noted in the HPI. Denies N/V/F/Ch.  Past Medical History:  Diagnosis Date   Fibroid     Current Outpatient Medications:    Carboxymethylcellulose Sodium 0.25 % SOLN, Apply to eye., Disp: , Rfl:    Cholecalciferol 50 MCG (2000 UT) CAPS, Take by mouth., Disp: , Rfl:    cyanocobalamin 100 MCG tablet, Take by mouth., Disp: , Rfl:    cyclobenzaprine (FLEXERIL) 10 MG tablet, TAKE ONE TABLET BY MOUTH EVERY 8 HOURS AS NEEDED FOR MUSCLE SPASM OR FOR MUSCLE TIGHTNESS, Disp: , Rfl:    fexofenadine (ALLEGRA) 180 MG tablet, Take by mouth., Disp: , Rfl:    fluticasone (FLONASE) 50 MCG/ACT nasal spray, Place into the nose., Disp: , Rfl:    folic acid (FOLVITE) 1 MG tablet, Take 1 tablet by mouth daily., Disp: , Rfl:    ibuprofen (ADVIL,MOTRIN) 800 MG tablet, Take 800 mg by mouth., Disp: , Rfl:    Lactobacillus Rhamnosus, GG, (CULTURELLE) CAPS, Take by mouth., Disp: , Rfl:    methotrexate (RHEUMATREX) 2.5 MG tablet, Take 2.5 mg by mouth once a week. Caution:Chemotherapy. Protect from light. Pt takes 5 tablets every Monday once a week, Disp: , Rfl:    Multiple Vitamin (MULTI-VITAMINS) TABS, Take by mouth., Disp: , Rfl:    Omega-3 1000 MG CAPS, Take by mouth., Disp: , Rfl:   Social History   Tobacco Use  Smoking Status Never  Smokeless  Tobacco Never    No Known Allergies Objective:  There were no vitals filed for this visit. There is no height or weight on file to calculate BMI. Constitutional Well developed. Well nourished.  Vascular Dorsalis pedis pulses palpable bilaterally. Posterior tibial pulses palpable bilaterally. Capillary refill normal to all digits.  No cyanosis or clubbing noted. Pedal hair growth normal.  Neurologic Normal speech. Oriented to person, place, and time. Epicritic sensation to light touch grossly present bilaterally.  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  Hyperkeratotic lesion noted on the dorsal aspect.  Pain on palpation.  Orthopedic: Normal joint ROM without pain or crepitus bilaterally. No visible deformities. No bony tenderness.   Radiographs: None Assessment:   1. Hammertoe of right foot    Plan:  Patient was evaluated and treated and all questions answered.  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  She states understand she will get back to me if she is ready to have  it discussed surgical options.  No follow-ups on file.  Right third digit hammertoe dorsal PIPJ joint callus debridement shoe changes toe protector

## 2023-05-24 ENCOUNTER — Ambulatory Visit: Payer: Medicare Other | Admitting: Podiatry

## 2024-02-20 ENCOUNTER — Telehealth: Payer: Self-pay

## 2024-02-20 NOTE — Telephone Encounter (Signed)
 Copied from CRM [id number]. Topic: Clinical - Medical Advice >> Feb 20, 2024 10:37 AM Carlatta H wrote: Reason for CRM: Patient would like so recommendation for OBGYN in the [facility] from Dr Aguiar//

## 2024-02-21 NOTE — Telephone Encounter (Signed)
 Lvm for pt to return call.

## 2024-03-24 NOTE — Progress Notes (Unsigned)
 68 y.o. G0P0000 Single African American female here for a breast and pelvic exam. Patient states that gastro doctor told her she has fibroids and they are growing, wants to discuss this. Seen in a CT scan.   CT 02/11/24 [facility] done for abdominal pain showed multiple fibroids, though to be increasing in size.  Report reviewed.   Last pelvic US  done 04/28/20 in this office showed multiple fibroids, largest 5.8 cm.    Denies vaginal bleeding.   Hx myomectomy.   Not sexually active for a while.   From [place] originally.   PCP: Aletha Bene, MD   No LMP recorded. Patient is postmenopausal.           Sexually active: No.  The current method of family planning is post menopausal status.    Menopausal hormone therapy:  n/a Exercising: Yes.    Walking Smoker:  no  OB History     Gravida  0   Para  0   Term  0   Preterm  0   AB  0   Living  0      SAB  0   IAB  0   Ectopic  0   Multiple  0   Live Births  0           HEALTH MAINTENANCE: Last 2 paps: 11/14/20 neg, 08/18/18 neg  History of abnormal Pap or positive HPV:  no Mammogram:  11/13/21 Breast Density Cat C, BIRADS Cat 1 neg.  She will call and schedule at [facility].    Colonoscopy:  12/20/21 Bone Density:  03/09/22  Result  osteopenia spine and hip.  FRAX 9.5%, 1.3%.  She will do this next year.   Immunization History  Administered Date(s) Administered   PFIZER(Purple Top)SARS-COV-2 Vaccination 01/27/2020, 02/17/2020   Tdap 03/21/2007, 05/14/2018      reports that she has never smoked. She has never used smokeless tobacco. She reports that she does not drink alcohol and does not use drugs.  Past Medical History:  Diagnosis Date   Fibroid     Past Surgical History:  Procedure Laterality Date   EYE SURGERY     myomectomy      Current Outpatient Medications  Medication Sig Dispense Refill   Carboxymethylcellulose Sodium 0.25 % SOLN Apply to eye.     Cholecalciferol 50 MCG (2000 UT) CAPS Take  by mouth.     cyanocobalamin 100 MCG tablet Take by mouth.     cyclobenzaprine (FLEXERIL) 10 MG tablet TAKE ONE TABLET BY MOUTH EVERY 8 HOURS AS NEEDED FOR MUSCLE SPASM OR FOR MUSCLE TIGHTNESS     fexofenadine (ALLEGRA) 180 MG tablet Take by mouth.     folic acid (FOLVITE) 1 MG tablet Take 1 tablet by mouth daily.     ibuprofen (ADVIL,MOTRIN) 800 MG tablet Take 800 mg by mouth.     Lactobacillus Rhamnosus, GG, (CULTURELLE) CAPS Take by mouth.     methotrexate (RHEUMATREX) 2.5 MG tablet Take 2.5 mg by mouth once a week. Caution:Chemotherapy. Protect from light. Pt takes 5 tablets every Monday once a week     Multiple Vitamin (MULTI-VITAMINS) TABS Take by mouth.     Omega-3 1000 MG CAPS Take by mouth.     fluticasone (FLONASE) 50 MCG/ACT nasal spray Place into the nose. (Patient not taking: Reported on 03/25/2024)     No current facility-administered medications for this visit.    ALLERGIES: Patient has no known allergies.  History reviewed. No pertinent family history.  Review of Systems  All other systems reviewed and are negative.   PHYSICAL EXAM:  BP 126/84 (BP Location: Left Arm, Patient Position: Sitting)   Pulse (!) 110   Ht 5' (1.524 m)   Wt 135 lb (61.2 kg)   SpO2 99%   BMI 26.37 kg/m     General appearance: alert, cooperative and appears stated age Head: normocephalic, without obvious abnormality, atraumatic Neck: no adenopathy, supple, symmetrical, trachea midline and thyroid normal to inspection and palpation Lungs: clear to auscultation bilaterally Breasts: normal appearance, no masses or tenderness, No nipple retraction or dimpling, No nipple discharge or bleeding, No axillary adenopathy Heart: regular rate and rhythm Abdomen: soft, non-tender; no masses, no organomegaly Extremities: extremities normal, atraumatic, no cyanosis or edema Skin: skin color, texture, turgor normal. No rashes or lesions Lymph nodes: cervical, supraclavicular, and axillary nodes  normal. Neurologic: grossly normal  Pelvic: External genitalia:  no lesions              No abnormal inguinal nodes palpated.              Urethra:  normal appearing urethra with no masses, tenderness or lesions              Bartholins and Skenes: normal                 Vagina: normal appearing vagina with normal color and discharge, no lesions              Cervix: no lesions              Pap taken: yes Bimanual Exam:  Uterus:  10 week size.  Nontender.              Adnexa: no mass, fullness, tenderness              Rectal exam: yes.  Confirms.              Anus:  normal sphincter tone, no lesions  Chaperone was present for exam:  Kari HERO, CMA  ASSESSMENT: Encounter for breast and pelvic exam.  Other personal risk factors not otherwise specified elsewhere.  Cervical cancer screening.  Uterine fibroids.  Possible enlarging.  We discussed the possibility of atypical fibroids and sarcoma cancer as the possible cause of enlarging fibroids in menopause.   PLAN: Mammogram screening discussed.  She will update.  Self breast awareness reviewed. Pap and reflex HRV collected:  yes Guidelines for Calcium, Vitamin D, regular exercise program including cardiovascular and weight bearing exercise. Medication refills:  NA Fibroids discussed.  Pelvic ultrasound recommended.  Will precert for here.  Patient may choose to do at [facility] as we may be out of network for her.  I do recommend she proceed whether it is with this office or another location.  Follow up:  yearly and prn.    Additional counseling given.  2yes 25 min  total time was spent for this patient encounter, including preparation, face-to-face counseling with the patient, coordination of care, and documentation of the encounter in addition to doing the breast and pelvic exam.

## 2024-03-25 ENCOUNTER — Other Ambulatory Visit (HOSPITAL_COMMUNITY)
Admission: RE | Admit: 2024-03-25 | Discharge: 2024-03-25 | Disposition: A | Discharge status: Home / Self Care | Source: Ambulatory Visit | Attending: Obstetrics and Gynecology | Admitting: Obstetrics and Gynecology

## 2024-03-25 ENCOUNTER — Encounter: Payer: Self-pay | Admitting: Obstetrics and Gynecology

## 2024-03-25 ENCOUNTER — Ambulatory Visit (INDEPENDENT_AMBULATORY_CARE_PROVIDER_SITE_OTHER): Admitting: Obstetrics and Gynecology

## 2024-03-25 VITALS — BP 126/84 | HR 110 | Ht 60.0 in | Wt 135.0 lb

## 2024-03-25 DIAGNOSIS — Z124 Encounter for screening for malignant neoplasm of cervix: Secondary | ICD-10-CM

## 2024-03-25 DIAGNOSIS — Z9189 Other specified personal risk factors, not elsewhere classified: Secondary | ICD-10-CM | POA: Diagnosis not present

## 2024-03-25 DIAGNOSIS — D219 Benign neoplasm of connective and other soft tissue, unspecified: Secondary | ICD-10-CM

## 2024-03-25 DIAGNOSIS — Z01419 Encounter for gynecological examination (general) (routine) without abnormal findings: Secondary | ICD-10-CM

## 2024-03-25 NOTE — Patient Instructions (Signed)

## 2024-03-31 LAB — CYTOLOGY - PAP: Diagnosis: NEGATIVE

## 2024-04-04 ENCOUNTER — Ambulatory Visit: Payer: Self-pay | Admitting: Obstetrics and Gynecology

## 2024-08-12 ENCOUNTER — Ambulatory Visit: Admitting: Family Medicine

## 2024-08-12 ENCOUNTER — Encounter: Payer: Self-pay | Admitting: Family Medicine

## 2024-08-12 VITALS — BP 122/78 | HR 107 | Ht 60.0 in | Wt 134.0 lb

## 2024-08-12 DIAGNOSIS — R634 Abnormal weight loss: Secondary | ICD-10-CM

## 2024-08-12 DIAGNOSIS — Z1231 Encounter for screening mammogram for malignant neoplasm of breast: Secondary | ICD-10-CM | POA: Diagnosis not present

## 2024-08-12 DIAGNOSIS — D869 Sarcoidosis, unspecified: Secondary | ICD-10-CM | POA: Diagnosis not present

## 2024-08-12 DIAGNOSIS — Z0000 Encounter for general adult medical examination without abnormal findings: Secondary | ICD-10-CM

## 2024-08-12 NOTE — Progress Notes (Signed)
 Complete physical exam  Assessment & Plan:    Routine Health Maintenance and Physical Exam Discussed health benefits of physical activity, and encouraged her to engage in regular exercise appropriate for her age and condition.  Preventative health care -     CBC with Differential/Platelet -     Comprehensive metabolic panel with GFR -     Lipid panel -     VITAMIN D  25 Hydroxy (Vit-D Deficiency, Fractures)  Sarcoidosis  Weight loss -     TSH  Screening mammogram for breast cancer   Recommend healthy diet i.e mediterranean/DASH diet, consistent exercise - 30 minutes 5 day per week Test results were reviewed and analyzed as part of the medical decision making of this visit.  Reviewed previous notes from [facility], notes from rheumatology lab work and diagnostic studies during the office visit. Mended that she get a 3D mammogram soon.  Patient is aware that she is past due.  Patient declines getting the Shingrix vaccine Return if symptoms worsen or fail to improve, for physical.        Subjective:  Patient ID: Jordan Miles, female    DOB: 1956/06/07  Age: 68 y.o. MRN: [id number] Chief Complaint  Patient presents with   Annual Exam   Establish Care    Jordan Miles is a 68 y.o. female who presents today for a complete physical exam. Colonoscopy-UTD DEXA scan Tetanus UTD Shingrix declines Mammogram- not yet, past due, will do next year History of Present Illness Jordan Miles is a 68 year old female with sarcoidosis who presents for a routine follow-up visit/physical exam   She has recently reduced her methotrexate dosage to three pills. She previously experienced issues when the medication was reduced too quickly.  Patient does see rheumatology in [facility].  She has noticed significant weight loss despite maintaining a healthy appetite and not being on a diet. Her sister has expressed concern about this weight change.  She is up to date with her  colonoscopy screenings but is behind on her mammogram, which was last done in 2023. She has undergone a bone density test and is taking calcium and vitamin D  supplements. She maintains an active lifestyle, walking regularly.  She had an echocardiogram at [facility]. She reports no current chest pain or shortness of breath.  States her rheumatologist noted a heart murmur and ordered an echo.  Echo was normal with a normal EF. FINDINGS  LEFT VENTRICLE  Normal LV size, wall thickness, wall motion and systolic function with ejection fraction 60-65%.  Normal left ventricular diastolic function and left atrial pressure.  -  RIGHT VENTRICLE  The right ventricle is normal in size and function.  LEFT ATRIUM  The left atrial size is normal. The atria are normal in size.  RIGHT ATRIUM  Right atrial size is normal.  -  AORTIC VALVE  Structurally normal aortic valve. There is no aortic stenosis. There is trace aortic regurgitation.  -  MITRAL VALVE  The mitral valve leaflets appear normal. There is mild mitral regurgitation.  -  TRICUSPID VALVE  Structurally normal tricuspid valve. There is mild tricuspid regurgitation. No pulmonary  hypertension.  -  PULMONIC VALVE  Structurally normal pulmonic valve. Trace to mild pulmonic valvular regurgitation.  -  ARTERIES  The aortic sinus is normal size. The ascending aorta is normal size.  -  VENOUS  Pulmonary venous flow pattern is normal. IVC size was normal.  She has not received a  shingles vaccine and is reluctant to get it.  Results Radiology Mammogram (2023): Within normal limits  Diagnostic Echocardiogram (2024): Normal cardiac structure and function; no evidence of murmur  Assessment and Plan Adult Wellness Visit Routine wellness visit with emphasis on screenings and vaccinations. Mammogram overdue. Bone density stable. No chronic diseases. Healthy lifestyle maintained. - Schedule mammogram screening. - Continue calcium and vitamin D   supplementation. - Maintain regular exercise and healthy diet.  Sarcoidosis Managed with methotrexate, reduced to three pills daily. Goal to discontinue methotrexate, but previous attempts led to symptom recurrence. - Continue methotrexate at current dose of three pills daily.  Abnormal weight loss Reports unintentional weight loss despite good appetite and high metabolism. No dietary changes or known causes identified. - Continue to monitor weight and dietary intake. The 10-year ASCVD risk score (Arnett DK, et al., 2019) is: 11.2%   Values used to calculate the score:     Age: 68 years     Clinically relevant sex: Female     Is Non-Hispanic African American: Yes     Diabetic: No     Tobacco smoker: No     Systolic Blood Pressure: 122 mmHg     Is BP treated: No     HDL Cholesterol: 63 mg/dL     Total Cholesterol: 208 mg/dL  Health Maintenance  Topic Date Due   Hepatitis C Screening  Never done   Zoster (Shingles) Vaccine (1 of 2) Never done   Breast Cancer Screening  11/14/2023   Flu Shot  Never done   COVID-19 Vaccine (3 - Pfizer risk series) 08/28/2024*   Medicare Annual Wellness Visit  09/12/2024*   DTaP/Tdap/Td vaccine (3 - Td or Tdap) 05/14/2028   Colon Cancer Screening  12/21/2031   Pneumococcal Vaccine for age over 42  Completed   Osteoporosis screening with Bone Density Scan  Completed   Meningitis B Vaccine  Aged Out  *Topic was postponed. The date shown is not the original due date.    Most recent fall risk assessment:     No data to display           Most recent depression screenings:     No data to display            The 10-year ASCVD risk score (Arnett DK, et al., 2019) is: 11.2%   Values used to calculate the score:     Age: 68 years     Clinically relevant sex: Female     Is Non-Hispanic African American: Yes     Diabetic: No     Tobacco smoker: No     Systolic Blood Pressure: 122 mmHg     Is BP treated: No     HDL Cholesterol: 63  mg/dL     Total Cholesterol: 208 mg/dL  Past Surgical History:  Procedure Laterality Date   BELPHAROPTOSIS REPAIR     EYE SURGERY     HERNIA REPAIR  1960   MYOMECTOMY  1996   ORBITOTOMY  07/31/2017   Procedure: ORBITOTOMY ANTERIOR via superior skin crease 1.5 hours With frozen sections; Surgeon: Hadassah Marca Falconer, MD; Location: [facility] OUTPATIENT OR; Service: Ophthalmology; Laterality: Right;   Social History[1] Social History   Socioeconomic History   Marital status: Single    Spouse name: Not on file   Number of children: Not on file   Years of education: Not on file   Highest education level: Not on file  Occupational History   Not on file  Tobacco  Use   Smoking status: Never   Smokeless tobacco: Never  Vaping Use   Vaping status: Never Used  Substance and Sexual Activity   Alcohol use: No    Alcohol/week: 0.0 standard drinks of alcohol   Drug use: No   Sexual activity: Not Currently    Birth control/protection: Post-menopausal    Comment: older than 16, more than 5,des neg  Other Topics Concern   Not on file  Social History Narrative   Originally from [place].    Social Drivers of Health   Tobacco Use: Low Risk (08/12/2024)   Patient History    Smoking Tobacco Use: Never    Smokeless Tobacco Use: Never    Passive Exposure: Not on file  Financial Resource Strain: Low Risk (03/30/2022)   Received from [facility]   Overall Financial Resource Strain (CARDIA)    Difficulty of Paying Living Expenses: Not very hard  Food Insecurity: Low Risk (07/16/2023)   Received from [facility]   Epic    Within the past 12 months, you worried that your food would run out before you got money to buy more: Never true    Within the past 12 months, the food you bought just didn't last and you didn't have money to get more. : Never true  Transportation Needs: No Transportation Needs (07/16/2023)   Received from [facility]    In the past 12  months, has lack of reliable transportation kept you from medical appointments, meetings, work or from getting things needed for daily living? : No  Physical Activity: Insufficiently Active (03/30/2022)   [facility] visits prior to 10/27/2022., [facility]   Exercise Vital Sign    On average, how many days per week do you engage in moderate to strenuous exercise (like a brisk walk)?: 3 days    On average, how many minutes do you engage in exercise at this level?: 30 min  Stress: No Stress Concern Present (03/30/2022)   Received [facility], [facility] visits prior to 10/27/2022.   [employer]ational [employer] - Occupational Stress Questionnaire    Feeling of Stress : Not at all  Social Connections: Moderately Integrated (03/30/2022)   Received [facility], [facility] visits prior to 10/27/2022.   Social Connection and Isolation Panel    In a typical week, how many times do you talk on the phone with family, friends, or neighbors?: More than three times a week    How often do you get together with friends or relatives?: Three times a week    How often do you attend church or religious services?: More than 4 times per year    Do you belong to any clubs or organizations such as church groups, unions, fraternal or athletic groups, or school groups?: Yes    How often do you attend meetings of the clubs or organizations you belong to?: 1 to 4 times per year    Are you married, widowed, divorced, separated, never married, or living with a partner?: Never married  Intimate Partner Violence: Not At Risk (03/30/2022)   Received from [facility] visits prior to 10/27/2022.   Epic    Within the last year, have you been afraid of your partner or ex-partner?: No    Within the last year, have you been humiliated or emotionally abused in other ways by your partner or ex-partner?: No  Within  the last year, have you been kicked, hit, slapped, or otherwise physically hurt by your partner or ex-partner?: No    Within the last year, have you been raped or forced to have any kind of sexual activity by your partner or ex-partner?: No  Depression (PHQ2-9): Not on file  Alcohol Screen: Not on file  Housing: Low Risk (07/16/2023)   Received from [facility]   Epic    What is your living situation today?: I have a steady place to live    Think about the place you live. Do you have problems with any of the following? Choose all that apply:: None/None on this list  Utilities: Low Risk (07/16/2023)   Received from [facility]   Utilities    In the past 12 months has the electric, gas, oil, or water company threatened to shut off services in your home? : No  Health Literacy: Not on file   Family History  Problem Relation Age of Onset   Colon cancer Mother    Thyroid disease Mother    Glaucoma Father    Blindness Father    Allergies[2]   Patient Care Team: Aletha Bene, MD as PCP - General (Family Medicine)   Show/hide medication list[3]  ROS     Objective:    BP 122/78   Pulse (!) 107   Ht 5' (1.524 m)   Wt 134 lb (60.8 kg)   SpO2 98%   BMI 26.17 kg/m  BP Readings from Last 3 Encounters:  08/12/24 122/78  03/25/24 126/84  01/26/22 114/70   Wt Readings from Last 3 Encounters:  08/12/24 134 lb (60.8 kg)  03/25/24 135 lb (61.2 kg)  01/26/22 133 lb (60.3 kg)    Physical Exam Vitals and nursing note reviewed.  Constitutional:      General: She is not in acute distress.    Appearance: Normal appearance.  HENT:     Head: Normocephalic.     Right Ear: Tympanic membrane, ear canal and external ear normal.     Left Ear: Tympanic membrane, ear canal and external ear normal.  Eyes:     Extraocular Movements: Extraocular movements intact.     Conjunctiva/sclera: Conjunctivae normal.     Pupils: Pupils are equal, round, and reactive to light.  Cardiovascular:      Rate and Rhythm: Regular rhythm. Tachycardia present.     Heart sounds: Normal heart sounds.  Pulmonary:     Effort: Pulmonary effort is normal.     Breath sounds: Normal breath sounds.  Chest:  Breasts:    Right: No swelling, bleeding, inverted nipple, mass, nipple discharge, skin change or tenderness.     Left: No swelling, bleeding, inverted nipple, mass, nipple discharge, skin change or tenderness.  Abdominal:     Tenderness: There is no abdominal tenderness.     Hernia: No hernia is present.  Musculoskeletal:     Right lower leg: No edema.     Left lower leg: No edema.  Neurological:     General: No focal deficit present.     Mental Status: She is alert and oriented to person, place, and time. Mental status is at baseline.     Cranial Nerves: No cranial nerve deficit.     Sensory: No sensory deficit.     Motor: No weakness.     Coordination: Coordination normal.     Gait: Gait normal.     Deep Tendon Reflexes: Reflexes normal.  Psychiatric:  Mood and Affect: Mood normal.        Behavior: Behavior normal.        Thought Content: Thought content normal.        Judgment: Judgment normal.      Results for orders placed or performed in visit on 08/12/24  HM DEXA SCAN  Result Value Ref Range   HM Dexa Scan report in chart   HM COLONOSCOPY  Result Value Ref Range   HM Colonoscopy See Report (in chart) See Report (in chart), Patient Reported        Connie Emperor, MD     [1]  Social History Tobacco Use   Smoking status: Never   Smokeless tobacco: Never  Vaping Use   Vaping status: Never Used  Substance Use Topics   Alcohol use: No    Alcohol/week: 0.0 standard drinks of alcohol   Drug use: No  [2] No Known Allergies [3]  Outpatient Medications Prior to Visit  Medication Sig   Carboxymethylcellulose Sodium 0.25 % SOLN Apply to eye.   Cholecalciferol 50 MCG (2000 UT) CAPS Take by mouth.   cyanocobalamin 100 MCG tablet Take by mouth.    cyclobenzaprine (FLEXERIL) 10 MG tablet TAKE ONE TABLET BY MOUTH EVERY 8 HOURS AS NEEDED FOR MUSCLE SPASM OR FOR MUSCLE TIGHTNESS   fexofenadine (ALLEGRA) 180 MG tablet Take by mouth.   folic acid (FOLVITE) 1 MG tablet Take 1 tablet by mouth daily.   ibuprofen (ADVIL,MOTRIN) 800 MG tablet Take 800 mg by mouth.   Lactobacillus Rhamnosus, GG, (CULTURELLE) CAPS Take by mouth.   methotrexate (RHEUMATREX) 2.5 MG tablet Take 2.5 mg by mouth once a week. Caution:Chemotherapy. Protect from light. Pt takes 5 tablets every Monday once a week   Multiple Vitamin (MULTI-VITAMINS) TABS Take by mouth.   Omega-3 1000 MG CAPS Take by mouth.   fluticasone (FLONASE) 50 MCG/ACT nasal spray Place into the nose. (Patient not taking: Reported on 08/12/2024)   No facility-administered medications prior to visit.

## 2024-08-13 LAB — CBC WITH DIFFERENTIAL/PLATELET
Absolute Lymphocytes: 638 {cells}/uL — ABNORMAL LOW (ref 850–3900)
Absolute Monocytes: 348 {cells}/uL (ref 200–950)
Basophils Absolute: 17 {cells}/uL (ref 0–200)
Basophils Relative: 0.3 %
Eosinophils Absolute: 51 {cells}/uL (ref 15–500)
Eosinophils Relative: 0.9 %
HCT: 39.7 % (ref 35.9–46.0)
Hemoglobin: 13.3 g/dL (ref 11.7–15.5)
MCH: 30.9 pg (ref 27.0–33.0)
MCHC: 33.5 g/dL (ref 31.6–35.4)
MCV: 92.3 fL (ref 81.4–101.7)
MPV: 10.8 fL (ref 7.5–12.5)
Monocytes Relative: 6.1 %
Neutro Abs: 4646 {cells}/uL (ref 1500–7800)
Neutrophils Relative %: 81.5 %
Platelets: 287 Thousand/uL (ref 140–400)
RBC: 4.3 Million/uL (ref 3.80–5.10)
RDW: 12.4 % (ref 11.0–15.0)
Total Lymphocyte: 11.2 %
WBC: 5.7 Thousand/uL (ref 3.8–10.8)

## 2024-08-13 LAB — COMPREHENSIVE METABOLIC PANEL WITH GFR
AG Ratio: 1.3 (calc) (ref 1.0–2.5)
ALT: 15 U/L (ref 6–29)
AST: 16 U/L (ref 10–35)
Albumin: 4.3 g/dL (ref 3.6–5.1)
Alkaline phosphatase (APISO): 67 U/L (ref 37–153)
BUN: 12 mg/dL (ref 7–25)
CO2: 26 mmol/L (ref 20–32)
Calcium: 9.6 mg/dL (ref 8.6–10.4)
Chloride: 105 mmol/L (ref 98–110)
Creat: 0.81 mg/dL (ref 0.60–1.00)
Globulin: 3.2 g/dL (ref 1.9–3.7)
Glucose, Bld: 90 mg/dL (ref 65–99)
Potassium: 4.1 mmol/L (ref 3.5–5.3)
Sodium: 139 mmol/L (ref 135–146)
Total Bilirubin: 0.8 mg/dL (ref 0.2–1.2)
Total Protein: 7.5 g/dL (ref 6.1–8.1)
eGFR: 78 mL/min/1.73m2 (ref >=60)

## 2024-08-13 LAB — LIPID PANEL
Cholesterol: 194 mg/dL (ref <200)
HDL: 65 mg/dL (ref >=50)
LDL Cholesterol (Calc): 112 mg/dL — ABNORMAL HIGH
Non-HDL Cholesterol (Calc): 129 mg/dL (ref <130)
Total CHOL/HDL Ratio: 3 (calc) (ref <5.0)
Triglycerides: 77 mg/dL (ref <150)

## 2024-08-13 LAB — TSH: TSH: 1.6 m[IU]/L (ref 0.40–4.50)

## 2024-08-13 LAB — VITAMIN D 25 HYDROXY (VIT D DEFICIENCY, FRACTURES): Vit D, 25-Hydroxy: 35 ng/mL (ref 30–100)

## 2024-08-14 ENCOUNTER — Ambulatory Visit: Payer: Self-pay | Admitting: Family Medicine

## 2024-08-14 NOTE — Progress Notes (Signed)
 Reviewed DPR, left detailed voice message.
